# Patient Record
Sex: Female | Born: 1945 | Race: White | Hispanic: No | Marital: Married | Smoking: Never smoker
Health system: Southern US, Community
[De-identification: ages and names within clinical notes are randomized; demographics above are authoritative.]

## PROBLEM LIST (undated history)

## (undated) DIAGNOSIS — I10 Essential (primary) hypertension: Secondary | ICD-10-CM

## (undated) DIAGNOSIS — E7800 Pure hypercholesterolemia, unspecified: Secondary | ICD-10-CM

## (undated) HISTORY — PX: APPENDECTOMY: SHX54

## (undated) HISTORY — PX: LEG SURGERY: SHX1003

## (undated) HISTORY — PX: ABDOMINAL HYSTERECTOMY: SHX81

## (undated) HISTORY — PX: TONSILLECTOMY: SUR1361

---

## 2003-06-29 ENCOUNTER — Other Ambulatory Visit: Payer: Self-pay

## 2008-10-08 ENCOUNTER — Ambulatory Visit: Payer: Self-pay | Admitting: Internal Medicine

## 2015-02-28 ENCOUNTER — Encounter: Payer: Self-pay | Admitting: Emergency Medicine

## 2015-02-28 ENCOUNTER — Ambulatory Visit
Admission: EM | Admit: 2015-02-28 | Discharge: 2015-02-28 | Disposition: A | Discharge status: Home / Self Care | Payer: BLUE CROSS/BLUE SHIELD | Attending: Family Medicine | Admitting: Family Medicine

## 2015-02-28 DIAGNOSIS — N390 Urinary tract infection, site not specified: Secondary | ICD-10-CM | POA: Diagnosis not present

## 2015-02-28 DIAGNOSIS — D696 Thrombocytopenia, unspecified: Secondary | ICD-10-CM

## 2015-02-28 HISTORY — DX: Essential (primary) hypertension: I10

## 2015-02-28 HISTORY — DX: Pure hypercholesterolemia, unspecified: E78.00

## 2015-02-28 LAB — COMPREHENSIVE METABOLIC PANEL
ALT: 17 U/L (ref 14–54)
AST: 21 U/L (ref 15–41)
Albumin: 4.5 g/dL (ref 3.5–5.0)
Alkaline Phosphatase: 65 U/L (ref 38–126)
Anion gap: 1 — ABNORMAL LOW (ref 5–15)
BILIRUBIN TOTAL: 0.7 mg/dL (ref 0.3–1.2)
BUN: 20 mg/dL (ref 6–20)
CO2: 32 mmol/L (ref 22–32)
CREATININE: 0.88 mg/dL (ref 0.44–1.00)
Calcium: 9.3 mg/dL (ref 8.9–10.3)
Chloride: 104 mmol/L (ref 101–111)
Glucose, Bld: 93 mg/dL (ref 65–99)
POTASSIUM: 4.2 mmol/L (ref 3.5–5.1)
Sodium: 137 mmol/L (ref 135–145)
Total Protein: 7.5 g/dL (ref 6.5–8.1)

## 2015-02-28 LAB — CBC WITH DIFFERENTIAL/PLATELET
BASOS ABS: 0 10*3/uL (ref 0–0.1)
Basophils Relative: 1 %
EOS PCT: 4 %
Eosinophils Absolute: 0.2 10*3/uL (ref 0–0.7)
HEMATOCRIT: 39.8 % (ref 35.0–47.0)
Hemoglobin: 13 g/dL (ref 12.0–16.0)
LYMPHS ABS: 1.1 10*3/uL (ref 1.0–3.6)
LYMPHS PCT: 26 %
MCH: 28 pg (ref 26.0–34.0)
MCHC: 32.8 g/dL (ref 32.0–36.0)
MCV: 85.3 fL (ref 80.0–100.0)
MONO ABS: 0.4 10*3/uL (ref 0.2–0.9)
MONOS PCT: 10 %
NEUTROS ABS: 2.7 10*3/uL (ref 1.4–6.5)
Neutrophils Relative %: 59 %
PLATELETS: 137 10*3/uL — AB (ref 150–440)
RBC: 4.67 MIL/uL (ref 3.80–5.20)
RDW: 14.4 % (ref 11.5–14.5)
WBC: 4.5 10*3/uL (ref 3.6–11.0)

## 2015-02-28 LAB — URINALYSIS COMPLETE WITH MICROSCOPIC (ARMC ONLY)
BACTERIA UA: NONE SEEN — AB
BILIRUBIN URINE: NEGATIVE
GLUCOSE, UA: NEGATIVE mg/dL
Ketones, ur: NEGATIVE mg/dL
Nitrite: NEGATIVE
PH: 5.5 (ref 5.0–8.0)
Protein, ur: NEGATIVE mg/dL
RBC / HPF: NONE SEEN RBC/hpf (ref <3)
Specific Gravity, Urine: 1.02 (ref 1.005–1.030)

## 2015-02-28 MED ORDER — SULFAMETHOXAZOLE-TRIMETHOPRIM 800-160 MG PO TABS: 1.0000 | ORAL_TABLET | Freq: Two times a day (BID) | ORAL | Status: DC

## 2015-02-28 NOTE — Discharge Instructions (Signed)
Pyelonephritis, Adult °Pyelonephritis is a kidney infection. In general, there are 2 main types of pyelonephritis: °· Infections that come on quickly without any warning (acute pyelonephritis). °· Infections that persist for a long period of time (chronic pyelonephritis). °CAUSES  °Two main causes of pyelonephritis are: °· Bacteria traveling from the bladder to the kidney. This is a problem especially in pregnant women. The urine in the bladder can become filled with bacteria from multiple causes, including: °· Inflammation of the prostate gland (prostatitis). °· Sexual intercourse in females. °· Bladder infection (cystitis). °· Bacteria traveling from the bloodstream to the tissue part of the kidney. °Problems that may increase your risk of getting a kidney infection include: °· Diabetes. °· Kidney stones or bladder stones. °· Cancer. °· Catheters placed in the bladder. °· Other abnormalities of the kidney or ureter. °SYMPTOMS  °· Abdominal pain. °· Pain in the side or flank area. °· Fever. °· Chills. °· Upset stomach. °· Blood in the urine (dark urine). °· Frequent urination. °· Strong or persistent urge to urinate. °· Burning or stinging when urinating. °DIAGNOSIS  °Your caregiver may diagnose your kidney infection based on your symptoms. A urine sample may also be taken. °TREATMENT  °In general, treatment depends on how severe the infection is.  °· If the infection is mild and caught early, your caregiver may treat you with oral antibiotics and send you home. °· If the infection is more severe, the bacteria may have gotten into the bloodstream. This will require intravenous (IV) antibiotics and a hospital stay. Symptoms may include: °¨ High fever. °¨ Severe flank pain. °¨ Shaking chills. °· Even after a hospital stay, your caregiver may require you to be on oral antibiotics for a period of time. °· Other treatments may be required depending upon the cause of the infection. °HOME CARE INSTRUCTIONS  °· Take your  antibiotics as directed. Finish them even if you start to feel better. °· Make an appointment to have your urine checked to make sure the infection is gone. °· Drink enough fluids to keep your urine clear or pale yellow. °· Take medicines for the bladder if you have urgency and frequency of urination as directed by your caregiver. °SEEK IMMEDIATE MEDICAL CARE IF:  °· You have a fever or persistent symptoms for more than 2-3 days. °· You have a fever and your symptoms suddenly get worse. °· You are unable to take your antibiotics or fluids. °· You develop shaking chills. °· You experience extreme weakness or fainting. °· There is no improvement after 2 days of treatment. °MAKE SURE YOU: °· Understand these instructions. °· Will watch your condition. °· Will get help right away if you are not doing well or get worse. °Document Released: 05/28/2005 Document Revised: 11/27/2011 Document Reviewed: 11/01/2010 °ExitCare® Patient Information ©2015 [employer]. This information is not intended to replace advice given to you by your health care provider. Make sure you discuss any questions you have with your health care provider. ° °Urinary Tract Infection °Urinary tract infections (UTIs) can develop anywhere along your urinary tract. Your urinary tract is your body's drainage system for removing wastes and extra water. Your urinary tract includes two kidneys, two ureters, a bladder, and a urethra. Your kidneys are a pair of bean-shaped organs. Each kidney is about the size of your fist. They are located below your ribs, one on each side of your spine. °CAUSES °Infections are caused by microbes, which are microscopic organisms, including fungi, viruses, and bacteria. These   organisms are so small that they can only be seen through a microscope. Bacteria are the microbes that most commonly cause UTIs. °SYMPTOMS  °Symptoms of UTIs may vary by age and gender of the patient and by the location of the infection. Symptoms in young  women typically include a frequent and intense urge to urinate and a painful, burning feeling in the bladder or urethra during urination. Older women and men are more likely to be tired, shaky, and weak and have muscle aches and abdominal pain. A fever may mean the infection is in your kidneys. Other symptoms of a kidney infection include pain in your back or sides below the ribs, nausea, and vomiting. °DIAGNOSIS °To diagnose a UTI, your caregiver will ask you about your symptoms. Your caregiver also will ask to provide a urine sample. The urine sample will be tested for bacteria and white blood cells. White blood cells are made by your body to help fight infection. °TREATMENT  °Typically, UTIs can be treated with medication. Because most UTIs are caused by a bacterial infection, they usually can be treated with the use of antibiotics. The choice of antibiotic and length of treatment depend on your symptoms and the type of bacteria causing your infection. °HOME CARE INSTRUCTIONS °· If you were prescribed antibiotics, take them exactly as your caregiver instructs you. Finish the medication even if you feel better after you have only taken some of the medication. °· Drink enough water and fluids to keep your urine clear or pale yellow. °· Avoid caffeine, tea, and carbonated beverages. They tend to irritate your bladder. °· Empty your bladder often. Avoid holding urine for long periods of time. °· Empty your bladder before and after sexual intercourse. °· After a bowel movement, women should cleanse from front to back. Use each tissue only once. °SEEK MEDICAL CARE IF:  °· You have back pain. °· You develop a fever. °· Your symptoms do not begin to resolve within 3 days. °SEEK IMMEDIATE MEDICAL CARE IF:  °· You have severe back pain or lower abdominal pain. °· You develop chills. °· You have nausea or vomiting. °· You have continued burning or discomfort with urination. °MAKE SURE YOU:  °· Understand these  instructions. °· Will watch your condition. °· Will get help right away if you are not doing well or get worse. °Document Released: 03/07/2005 Document Revised: 11/27/2011 Document Reviewed: 07/06/2011 °ExitCare® Patient Information ©2015 [employer]. This information is not intended to replace advice given to you by your health care provider. Make sure you discuss any questions you have with your health care provider. ° °

## 2015-02-28 NOTE — ED Provider Notes (Addendum)
CSN: [id number]     Arrival date & time 02/28/15  4098 History   First MD Initiated Contact with Patient 02/28/15 1047     Chief Complaint  Patient presents with  . Flank Pain   (Consider location/radiation/quality/duration/timing/severity/associated sxs/prior Treatment) HPI Comments: Single caucasian female here for evaluation of right flank pain decreased urinary output.  Patient took OTC test that was positive for UTI over the weekend.  Contacted PCM and urologist office this am and was told to go to urgent care this am or could have afternoon appt per patient.  Patient reported headache (usual daily), right flank pain denied fever, chills, vomiting, nausea, dyspnea, dysphagia, diarrhea.  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  Patient required IV antibiotics for last UTI and unsure what she was on.  Has history of thrombocytopenia  The history is provided by the patient.    Past Medical History  Diagnosis Date  . Hypertension   . Hypercholesteremia    Past Surgical History  Procedure Laterality Date  . Leg surgery Left   . Abdominal hysterectomy    . Tonsillectomy    . Appendectomy     History reviewed. No pertinent family history. Social History  Substance Use Topics  . Smoking status: Never Smoker   . Smokeless tobacco: None  . Alcohol Use: No   OB History    No data available     Review of Systems  Constitutional: Negative for fever, chills, diaphoresis, activity change, appetite change and fatigue.  HENT: Negative for congestion, dental problem, drooling, ear discharge, ear pain, facial swelling, hearing loss, mouth sores, nosebleeds, postnasal drip, rhinorrhea, sinus pressure, sneezing, sore throat, tinnitus, trouble swallowing and voice change.   Eyes: Negative for photophobia, pain, discharge, redness, itching and visual disturbance.  Respiratory: Negative  for cough, shortness of breath, wheezing and stridor.   Cardiovascular: Negative for chest pain and leg swelling.  Gastrointestinal: Negative for nausea, vomiting, abdominal pain, diarrhea, constipation, blood in stool and abdominal distention.  Endocrine: Negative for cold intolerance and heat intolerance.  Genitourinary: Positive for flank pain and decreased urine volume. Negative for dysuria, hematuria and difficulty urinating.  Musculoskeletal: Positive for back pain and gait problem. Negative for myalgias, joint swelling, arthralgias, neck pain and neck stiffness.  Skin: Negative for color change, pallor, rash and wound.  Allergic/Immunologic: Negative for environmental allergies and food allergies.  Neurological: Positive for headaches. Negative for dizziness, tremors, seizures, syncope, facial asymmetry, speech difficulty, weakness, light-headedness and numbness.  Hematological: Negative for adenopathy. Does not bruise/bleed easily.  Psychiatric/Behavioral: Positive for sleep disturbance. Negative for behavioral problems, confusion and agitation.    Allergies  Review of patient's allergies indicates no known allergies.  Home Medications   Prior to Admission medications   Medication Sig Start Date End Date Taking? Authorizing Provider  citalopram (CELEXA) 20 MG tablet Take 20 mg by mouth daily.   Yes Historical Provider, MD  lisinopril (PRINIVIL,ZESTRIL) 40 MG tablet Take 40 mg by mouth daily.   Yes Historical Provider, MD  potassium chloride (K-DUR,KLOR-CON) 10 MEQ tablet Take 10 mEq by mouth 4 (four) times daily.   Yes Historical Provider, MD  simvastatin (ZOCOR) 20 MG tablet Take 20 mg by mouth daily.   Yes Historical Provider, MD  vitamin B-12 (CYANOCOBALAMIN) 500 MCG tablet Take 500 mcg by mouth daily.   Yes Historical Provider, MD  vitamin E 1000 UNIT capsule Take  1,000 Units by mouth daily.   Yes Historical Provider, MD  sulfamethoxazole-trimethoprim (BACTRIM DS,SEPTRA DS)  800-160 MG per tablet Take 1 tablet by mouth 2 (two) times daily. 02/28/15   Barbaraann Barthel, NP   Meds Ordered and Administered this Visit  Medications - No data to display  BP 178/78 mmHg  Pulse 65  Temp(Src) 98 F (36.7 C) (Oral)  Resp 16  Ht  (1.676 m)  Wt 215 lb (97.523 kg)  BMI 34.72 kg/m2  SpO2 97% No data found.   Physical Exam  Constitutional: She is oriented to person, place, and time. Vital signs are normal. She appears well-developed and well-nourished. No distress.  HENT:  Head: Normocephalic and atraumatic.  Right Ear: External ear normal.  Left Ear: External ear normal.  Nose: Nose normal.  Mouth/Throat: Oropharynx is clear and moist. No oropharyngeal exudate.  Eyes: Conjunctivae, EOM and lids are normal. Pupils are equal, round, and reactive to light. Right eye exhibits no discharge. Left eye exhibits no discharge. No scleral icterus.  Neck: Trachea normal and normal range of motion. Neck supple. No tracheal deviation present.  Cardiovascular: Normal rate, regular rhythm, S1 normal, S2 normal, normal heart sounds and intact distal pulses.  PMI is not displaced.  Exam reveals no gallop, no distant heart sounds, no friction rub and no decreased pulses.   No murmur heard. Pulmonary/Chest: Effort normal and breath sounds normal. No accessory muscle usage or stridor. No respiratory distress. She has no decreased breath sounds. She has no wheezes. She has no rhonchi. She has no rales. She exhibits no tenderness.  Abdominal: Soft. Bowel sounds are normal. She exhibits no shifting dullness, no distension, no pulsatile liver, no fluid wave, no abdominal bruit, no ascites, no pulsatile midline mass and no mass. There is no hepatosplenomegaly. There is tenderness in the right upper quadrant, right lower quadrant and left upper quadrant. There is CVA tenderness. There is no rigidity, no rebound, no guarding, no tenderness at McBurney's point and negative Murphy's sign. Hernia  confirmed negative in the ventral area.  RUQ most tender to palpations then RLQ and LUQ; right CVA tenderness; dull to percussion x 4 quads  Musculoskeletal: She exhibits edema. She exhibits no tenderness.       Left knee: She exhibits decreased range of motion.       Right lower leg: She exhibits swelling and edema. She exhibits no tenderness, no bony tenderness, no deformity and no laceration.       Left lower leg: Normal.  0-1+/4 right lower extremity nonpitting edema; titanium rod LLE keeps knee full extension  Lymphadenopathy:    She has no cervical adenopathy.  Neurological: She is alert and oriented to person, place, and time. She has normal reflexes. She displays no atrophy, no tremor and normal reflexes. No cranial nerve deficit or sensory deficit. She exhibits normal muscle tone. She displays no seizure activity. Gait abnormal. Coordination normal. GCS eye subscore is 4. GCS verbal subscore is 5. GCS motor subscore is 6.  Skin: Skin is warm, dry and intact. No abrasion, no bruising, no burn, no ecchymosis, no laceration, no lesion, no petechiae and no rash noted. She is not diaphoretic. No cyanosis or erythema. No pallor. Nails show no clubbing.  Psychiatric: She has a normal mood and affect. Her speech is normal and behavior is normal. Judgment and thought content normal. Cognition and memory are normal.  Nursing note and vitals reviewed.   ED Course  Procedures (including critical care time)  Labs Review Labs Reviewed  URINALYSIS COMPLETEWITH MICROSCOPIC ([facility] ONLY) - Abnormal; Notable for the following:    Hgb urine dipstick 1+ (*)    Leukocytes, UA 1+ (*)    Bacteria, UA NONE SEEN (*)    Squamous Epithelial / LPF 0-5 (*)    All other components within normal limits  CBC WITH DIFFERENTIAL/PLATELET - Abnormal; Notable for the following:    Platelets 137 (*)    All other components within normal limits  COMPREHENSIVE METABOLIC PANEL - Abnormal; Notable for the following:     Anion gap 1 (*)    All other components within normal limits  URINE CULTURE  Micro Report: Final 06/08/2004 14:25 Acc# [id number] Acct# [id number]  CULTURE BACTERIA URINE Updated: 06/11/2004 11:47 SOURCE: URINE-CLEAN CATCH  FINAL REPORT:CULTURE FINAL REPORT COLONIES/ML ENTEROBACTER AEROGENES   SUSCEPTIBILITY: E AEROGE   AMPICILLIN R  PIPERACIL/TAZOB I  AMOX/CLAVULAN A R  CEFAZOLIN R  GENTAMICIN S  TRIMETH/SULFA S  NITROFURANTOIN I  CIPROFLOXACIN S   ATTENDING MD: Sula Rumple MD: [place].Blalock  PERFORMED BY: CLINICAL MICRO LAB RM [address] [facility] [place] [address]  >=100,000 colonies/mL Escherichia coli (A) Comment: strain 1   Culture Urine >=100,000 colonies/mL Escherichia coli (A) Comment: strain 2    Specimen  Urine - Urinary Bladder   Result Narrative   Specific collection method (i.e. Clean catch, Straight catheter, Indwelling catheter) is required for accurate processing of urine samples. If collection method not specified, specimen processed according to Clean Catch urine protocol.   Organism Antibiotic Method Susceptibility  Escherichia coli Amikacin MIC Susceptible   Ampicillin MIC Susceptible   Ampicillin + Sulbactam MIC Susceptible   Cefazolin MIC Resistant   Comment: Cefazolin results predict results for oral agents cephalexin,cefaclor,cefdinir,etc.   Cefepime MIC Susceptible   Ceftriaxone MIC Resistant   Cefuroxime MIC Susceptible   Ciprofloxacin MIC Resistant   Ertapenem MIC Susceptible   Gentamicin MIC Susceptible   Meropenem MIC Susceptible   Nitrofurantoin MIC Susceptible   Piperacillin/Tazobactam MIC Susceptible   Tetracycline MIC Susceptible   Tobramycin MIC Susceptible   Trimethoprim + Sulfamethoxazole MIC Susceptible  Escherichia coli Amikacin MIC Susceptible   Ampicillin MIC Susceptible   Ampicillin + Sulbactam MIC Susceptible   Cefazolin MIC Resistant   Comment: Cefazolin results predict results for oral agents  cephalexin,cefaclor,cefdinir,etc.   Cefepime MIC Susceptible   Ceftriaxone MIC Resistant   Cefuroxime MIC Susceptible   Ciprofloxacin MIC Resistant   Ertapenem MIC Susceptible   Gentamicin MIC Susceptible   Meropenem MIC Susceptible   Nitrofurantoin MIC Susceptible   Piperacillin/Tazobactam MIC Susceptible   Tetracycline MIC Susceptible   Tobramycin MIC Susceptible   Trimethoprim + Sulfamethoxazole MIC Susceptible    Imaging Review No results found. 1100 discussed urinalysis results with patient and given copy of report.  Patient cannot remember what antibiotics she was on during hospitalization or discharge to home May/June 2016.  1140 Discussed with patient CBC normal except slightly low platelets stable from previous.  CMP still pending.  Drinking water without difficulty and sitting in chair in exam room.  Patient verbalized understanding of information and had no further questions at this time.   1205 Discussed CMP results with patient normal. MDM   1. Thrombocytopenia   2. UTI (lower urinary tract infection)    Will call patient with urine culture results once available typically 48 hours.  Medications as directed.  Patient is also to push fluids.  Scheduled follow up with [facility] or urology in 48 hours  for re-evaluation/labs as bactrim may cause hyperkalemia but previous urine cultures with resistance to all other po antibiotics upon review of care everywhere.    Hydrate, avoid dehydration.  Avoid holding urine void on frequent basis every 4 to 6 hours.  If unable to void every 8 hours follow up for re-evaluation with PCM, urgent care or ER.   Call or return to clinic as needed if these symptoms worsen or fail to improve as anticipated.  Exitcare handout on cystitis, pyelonephritis given to patient Patient verbalized agreement and understanding of treatment plan and had no further questions at this time. P2:  Hydrate and cranberry juice  Thrombocytopenia follow up with PCM for  re-evaluation  This week sooner (same day) if bruises noted or difficulty with blood clotting after cuts.  Patient verbalized understanding of information/instructions, agreed with plan of care and had no further questions at this time. P2 avoid cuts/trauma  Barbaraann Barthel, NP 02/28/15 1217  Telephone message left for patient to contact clinic to verify if follow up appt with urology or PCM kept for repeat labs this week. Urine culture contaminated and if still symptomatic recommend repeat labs.  03 Mar 2015 at 0714  Barbaraann Barthel, NP 03/03/15 1610  11 Mar 2015 at 1408 Contacted patient via telephone notified urine culture contaminated/grew multiple species.  Patient reported symptoms resolved with prescribed therapy.  Patient verbalized understanding of information and had no further questions at this time  Barbaraann Barthel, NP 03/11/15 1409

## 2015-02-28 NOTE — ED Notes (Signed)
Patient c/o right sided flank pain since last Thursday.  Patient denies fevers.  Patient denies N/V.

## 2015-03-02 LAB — URINE CULTURE: Special Requests: NORMAL

## 2015-06-03 ENCOUNTER — Encounter: Payer: Self-pay | Admitting: Emergency Medicine

## 2015-06-03 ENCOUNTER — Ambulatory Visit
Admission: EM | Admit: 2015-06-03 | Discharge: 2015-06-03 | Disposition: A | Discharge status: Home / Self Care | Payer: BLUE CROSS/BLUE SHIELD | Attending: Family Medicine | Admitting: Family Medicine

## 2015-06-03 DIAGNOSIS — N390 Urinary tract infection, site not specified: Secondary | ICD-10-CM | POA: Diagnosis not present

## 2015-06-03 LAB — URINALYSIS COMPLETE WITH MICROSCOPIC (ARMC ONLY)
Bacteria, UA: NONE SEEN
Bilirubin Urine: NEGATIVE
Glucose, UA: NEGATIVE mg/dL
KETONES UR: NEGATIVE mg/dL
Nitrite: NEGATIVE
PH: 5.5 (ref 5.0–8.0)
Protein, ur: 30 mg/dL — AB
Specific Gravity, Urine: 1.03 (ref 1.005–1.030)

## 2015-06-03 MED ORDER — PHENAZOPYRIDINE HCL 200 MG PO TABS: 200.0000 mg | ORAL_TABLET | Freq: Three times a day (TID) | ORAL | Status: DC

## 2015-06-03 MED ORDER — SULFAMETHOXAZOLE-TRIMETHOPRIM 800-160 MG PO TABS
1.0000 | ORAL_TABLET | Freq: Two times a day (BID) | ORAL | Status: DC
Start: 2015-06-03 — End: 2020-03-09

## 2015-06-03 NOTE — Discharge Instructions (Signed)
Urinary Tract Infection Urinary tract infections (UTIs) can develop anywhere along your urinary tract. Your urinary tract is your body's drainage system for removing wastes and extra water. Your urinary tract includes two kidneys, two ureters, a bladder, and a urethra. Your kidneys are a pair of bean-shaped organs. Each kidney is about the size of your fist. They are located below your ribs, one on each side of your spine. CAUSES Infections are caused by microbes, which are microscopic organisms, including fungi, viruses, and bacteria. These organisms are so small that they can only be seen through a microscope. Bacteria are the microbes that most commonly cause UTIs. SYMPTOMS  Symptoms of UTIs may vary by age and gender of the patient and by the location of the infection. Symptoms in young women typically include a frequent and intense urge to urinate and a painful, burning feeling in the bladder or urethra during urination. Older women and men are more likely to be tired, shaky, and weak and have muscle aches and abdominal pain. A fever may mean the infection is in your kidneys. Other symptoms of a kidney infection include pain in your back or sides below the ribs, nausea, and vomiting. DIAGNOSIS To diagnose a UTI, your caregiver will ask you about your symptoms. Your caregiver will also ask you to provide a urine sample. The urine sample will be tested for bacteria and white blood cells. White blood cells are made by your body to help fight infection. TREATMENT  Typically, UTIs can be treated with medication. Because most UTIs are caused by a bacterial infection, they usually can be treated with the use of antibiotics. The choice of antibiotic and length of treatment depend on your symptoms and the type of bacteria causing your infection. HOME CARE INSTRUCTIONS  If you were prescribed antibiotics, take them exactly as your caregiver instructs you. Finish the medication even if you feel better after  you have only taken some of the medication.  Drink enough water and fluids to keep your urine clear or pale yellow.  Avoid caffeine, tea, and carbonated beverages. They tend to irritate your bladder.  Empty your bladder often. Avoid holding urine for long periods of time.  Empty your bladder before and after sexual intercourse.  After a bowel movement, women should cleanse from front to back. Use each tissue only once. SEEK MEDICAL CARE IF:   You have back pain.  You develop a fever.  Your symptoms do not begin to resolve within 3 days. SEEK IMMEDIATE MEDICAL CARE IF:   You have severe back pain or lower abdominal pain.  You develop chills.  You have nausea or vomiting.  You have continued burning or discomfort with urination. MAKE SURE YOU:   Understand these instructions.  Will watch your condition.  Will get help right away if you are not doing well or get worse.   This information is not intended to replace advice given to you by your health care provider. Make sure you discuss any questions you have with your health care provider.   Document Released: 03/07/2005 Document Revised: 02/16/2015 Document Reviewed: 07/06/2011 Elsevier Interactive Patient Education 2016 [employer]  Urine Culture and Sensitivity Testing WHY AM I HAVING THIS TEST?  A urine culture is a test to see if germs grow from your urine sample. Normally, urine is free of germs (sterile). Germs in urine are usually bacteria. Sometimes they can be yeasts. These germs can cause a urinary tract infection (UTI). You may have this test if  you have symptoms of a UTI. These may include:  Frequent urination.  Burning pain when passing urine. If you are pregnant, your health care provider may order this test to screen you for a UTI. When you pass urine, the urine flows through the tube that empties your bladder (urethra). In men, urine comes out through an opening at the tip of the penis. In women, it  comes out of the body from just above the vaginal opening. These areas may have bacteria near them that normally live on the skin (normal flora). WHAT KIND OF SAMPLE IS TAKEN? A urine sample for a culture test must be collected in a way that keeps normal flora from getting into the sample. The method used most often is called a clean-catch sample. In a few cases, urine may need to be collected directly from the bladder using a thin, flexible tube (catheter). The health care provider puts the catheter through the person's urethra and into the bladder. Your urine sample will be placed onto plates containing a substance that encourages bacteria to grow (agar plates). These plates are kept at body temperature for 24-48 hours to see if bacteria or other germs grow. Then, a lab technician examines them under a microscope to check for germs. Any germs that grow from the culture will be tested against a variety of medicines to find the one that works best (sensitivity testing). For a UTI caused by bacteria, several types of antibiotic medicines may be tested. HOW DO I PREPARE FOR THE TEST?  Do not urinate for about an hour before collecting the sample.  Drink a Jorge of water about 20 minutes before collecting the sample.  Tell your health care provider if you have been taking antibiotics. This may affect the results of your test. Your health care provider may give you sterile wipes to clean your vagina or penis to prepare for collecting a clean-catch sample. To collect the sample, you will need to do the following: For Women and Girls  Sit on the toilet and spread the lips of your vagina.  Use one wipe to clean your vaginal area from front to back.  Use a second wipe to clean the opening of your urethra.  Pass a small amount of urine directly into the toilet while still spreading your vagina.  Then, hold the sterile cup underneath you and urinate into it.  Fill the cup about halfway. Cap it and  return it for testing. For Men and Boys  Use the sterile wipe to clean the tip of your penis.  Pass a small amount of urine directly into the toilet first.  Then, urinate into the sterile cup.  Fill the cup about halfway. Cap it and return it for testing. WHAT DO THE RESULTS MEAN? The result of a urine culture and sensitivity test will be positive or negative.   If enough bacteria grow from your urine sample, your test result is considered positive.  If many different bacteria grow from your urine sample, your test may be reported as contaminated.  If no bacteria grow from your sample after 24-48 hours, your test result is considered negative.  Results of sensitivity testing let your health care provider know which medicines to use to treat your infection. If the results of your urine culture are negative, this means:  It is less likely that you have a UTI.  Your test may be repeated if you still have symptoms. If the results of your urine culture are positive,  this means:  It is more likely that you have a UTI.  You may need to start treatment based on your sensitivity results. Talk to your health care provider to discuss your results, treatment options, and if necessary, the need for more tests. It is your responsibility to obtain your test results. Ask the lab or department performing the test when and how you will get your results. Talk with your health care provider if you have any questions about your results.   This information is not intended to replace advice given to you by your health care provider. Make sure you discuss any questions you have with your health care provider.   Document Released: 06/22/2004 Document Revised: 06/18/2014 Document Reviewed: 09/24/2013 Elsevier Interactive Patient Education [employer] Elsevier Inc.

## 2015-06-03 NOTE — ED Notes (Signed)
Burning, pressure, frequent urination  and pain for 2 days

## 2015-06-03 NOTE — ED Provider Notes (Signed)
CSN: [id number]     Arrival date & time 06/03/15  1349 History   First MD Initiated Contact with Patient 06/03/15 1431     Chief Complaint  Patient presents with  . Dysuria   (Consider location/radiation/quality/duration/timing/severity/associated sxs/prior Treatment) HPI  This 69 year old female who presents with dysuria and burning lower abdominal pressure frequency urgency and in satiety for the past 2 days. She states it seems that she be getting a UTI every 6 months. She recently underwent a stenting of her urethra from a previous UTI. She denies any vaginal discharge. Denies any fever is afebrile the office today. She said no chills and denies any back pain other than some right flank pain that she's had since having a indwelling catheter removed. This is chronic.  Past Medical History  Diagnosis Date  . Hypertension   . Hypercholesteremia    Past Surgical History  Procedure Laterality Date  . Leg surgery Left   . Abdominal hysterectomy    . Tonsillectomy    . Appendectomy     Family History  Problem Relation Age of Onset  . Coronary artery disease Mother   . Hypertension Mother   . Coronary artery disease Father   . Hypertension Father   . Cancer Sister   . Cancer Brother    Social History  Substance Use Topics  . Smoking status: Never Smoker   . Smokeless tobacco: None  . Alcohol Use: No   OB History    No data available     Review of Systems  Constitutional: Negative for fever, chills, diaphoresis, activity change, appetite change and fatigue.  Genitourinary: Positive for dysuria, urgency, frequency, decreased urine volume and difficulty urinating. Negative for vaginal discharge.  All other systems reviewed and are negative.   Allergies  Review of patient's allergies indicates no known allergies.  Home Medications   Prior to Admission medications   Medication Sig Start Date End Date Taking? Authorizing Provider  amLODipine (NORVASC) 10 MG tablet Take  10 mg by mouth daily.   Yes Historical Provider, MD  citalopram (CELEXA) 20 MG tablet Take 20 mg by mouth daily.    Historical Provider, MD  lisinopril (PRINIVIL,ZESTRIL) 40 MG tablet Take 40 mg by mouth daily.    Historical Provider, MD  phenazopyridine (PYRIDIUM) 200 MG tablet Take 1 tablet (200 mg total) by mouth 3 (three) times daily. 06/03/15   Lutricia FeilWilliam P Madelynne Lasker, PA-C  potassium chloride (K-DUR,KLOR-CON) 10 MEQ tablet Take 10 mEq by mouth 4 (four) times daily.    Historical Provider, MD  simvastatin (ZOCOR) 20 MG tablet Take 20 mg by mouth daily.    Historical Provider, MD  sulfamethoxazole-trimethoprim (BACTRIM DS,SEPTRA DS) 800-160 MG tablet Take 1 tablet by mouth 2 (two) times daily. 06/03/15   Lutricia FeilWilliam P Clois Treanor, PA-C  vitamin B-12 (CYANOCOBALAMIN) 500 MCG tablet Take 500 mcg by mouth daily.    Historical Provider, MD  vitamin E 1000 UNIT capsule Take 1,000 Units by mouth daily.    Historical Provider, MD   Meds Ordered and Administered this Visit  Medications - No data to display  BP 142/81 mmHg  Pulse 72  Temp(Src) 97.8 F (36.6 C) (Tympanic)  Resp 16  Ht 5\' 7"  (1.702 m)  Wt 205 lb (92.987 kg)  BMI 32.10 kg/m2  SpO2 98% No data found.   Physical Exam  Constitutional: She is oriented to person, place, and time. She appears well-developed and well-nourished. No distress.  HENT:  Head: Normocephalic and atraumatic.  Eyes: Conjunctivae  are normal. Pupils are equal, round, and reactive to light.  Neck: Normal range of motion. Neck supple.  Pulmonary/Chest: Breath sounds normal. No respiratory distress. She has no wheezes. She has no rales.  Abdominal: Soft. Bowel sounds are normal. She exhibits no distension. There is tenderness. There is no rebound and no guarding.  Musculoskeletal: Normal range of motion. She exhibits no edema or tenderness.  Left knee is fused in extension  Neurological: She is alert and oriented to person, place, and time.  Skin: Skin is warm and dry. No  rash noted. She is not diaphoretic. No erythema.  Psychiatric: She has a normal mood and affect. Her behavior is normal. Judgment and thought content normal.  Nursing note and vitals reviewed.   ED Course  Procedures (including critical care time)  Labs Review Labs Reviewed  URINALYSIS COMPLETEWITH MICROSCOPIC ([facility] ONLY) - Abnormal; Notable for the following:    APPearance CLOUDY (*)    Hgb urine dipstick 1+ (*)    Protein, ur 30 (*)    Leukocytes, UA 2+ (*)    Squamous Epithelial / LPF 0-5 (*)    All other components within normal limits    Imaging Review No results found.   Visual Acuity Review  Right Eye Distance:   Left Eye Distance:   Bilateral Distance:    Right Eye Near:   Left Eye Near:    Bilateral Near:         MDM   1. UTI (urinary tract infection), uncomplicated    Discharge Medication List as of 06/03/2015  2:53 PM    START taking these medications   Details  phenazopyridine (PYRIDIUM) 200 MG tablet Take 1 tablet (200 mg total) by mouth 3 (three) times daily., Starting 06/03/2015, Until Discontinued, Normal      Plan: 1. Test/x-ray results and diagnosis reviewed with patient 2. rx as per orders; risks, benefits, potential side effects reviewed with patient 3. Recommend supportive treatment with Fluids,rest. 4. F/u prn if symptoms worsen or don't improve. Call in 48 hours for C&S results. Patient placed on Septra DS BID x 5 days.      Lutricia Feil, PA-C 06/03/15 [phone]

## 2015-06-29 ENCOUNTER — Ambulatory Visit
Admission: EM | Admit: 2015-06-29 | Discharge: 2015-06-29 | Payer: BLUE CROSS/BLUE SHIELD | Attending: Family Medicine | Admitting: Family Medicine

## 2015-06-29 DIAGNOSIS — S0181XA Laceration without foreign body of other part of head, initial encounter: Secondary | ICD-10-CM

## 2015-06-29 MED ORDER — TETANUS-DIPHTH-ACELL PERTUSSIS 5-2.5-18.5 LF-MCG/0.5 IM SUSP
0.5000 mL | Freq: Once | INTRAMUSCULAR | Status: AC
  Administered 2015-06-29: 0.5 mL via INTRAMUSCULAR

## 2015-06-29 NOTE — Discharge Instructions (Signed)
Facial Laceration °A facial laceration is a cut on the face. These injuries can be painful and cause bleeding. Some cuts may need to be closed with stitches (sutures), skin adhesive strips, or wound glue. Cuts usually heal quickly but can leave a scar. It can take 1-2 years for the scar to go away completely. °HOME CARE  °· Only take medicines as told by your doctor. °· Follow your doctor's instructions for wound care. °For Stitches: °· Keep the cut clean and dry. °· If you have a bandage (dressing), change it at least once a day. Change the bandage if it gets wet or dirty, or as told by your doctor. °· Wash the cut with soap and water 2 times a day. Rinse the cut with water. Pat it dry with a clean towel. °· Put a thin layer of medicated cream on the cut as told by your doctor. °· You may shower after the first 24 hours. Do not soak the cut in water until the stitches are removed. °· Have your stitches removed as told by your doctor. °· Do not wear any makeup until a few days after your stitches are removed. °For Skin Adhesive Strips: °· Keep the cut clean and dry. °· Do not get the strips wet. You may take a bath, but be careful to keep the cut dry. °· If the cut gets wet, pat it dry with a clean towel. °· The strips will fall off on their own. Do not remove the strips that are still stuck to the cut. °For Wound Glue: °· You may shower or take baths. Do not soak or scrub the cut. Do not swim. Avoid heavy sweating until the glue falls off on its own. After a shower or bath, pat the cut dry with a clean towel. °· Do not put medicine or makeup on your cut until the glue falls off. °· If you have a bandage, do not put tape over the glue. °· Avoid lots of sunlight or tanning lamps until the glue falls off. °· The glue will fall off on its own in 5-10 days. Do not pick at the glue. °After Healing: °· Put sunscreen on the cut for the first year to reduce your scar. °GET HELP IF: °· You have a fever. °GET HELP RIGHT AWAY  IF:  °· Your cut area gets red, painful, or puffy (swollen). °· You see a yellowish-white fluid (pus) coming from the cut. °  °This information is not intended to replace advice given to you by your health care provider. Make sure you discuss any questions you have with your health care provider. °  °Document Released: 11/14/2007 Document Revised: 06/18/2014 Document Reviewed: 01/08/2013 °Elsevier Interactive Patient Education ©2016 Elsevier Inc. ° °

## 2015-06-29 NOTE — ED Notes (Signed)
States slipped and fell hitting forehead on metal railing. Denies LOC. Also c/o right hand pain. 3-4 inch vertical laceration above left eye. Dr. Thurmond Butts at bedside

## 2015-06-29 NOTE — ED Notes (Signed)
Pt advised transfer to ED. EMS transport requested at this time by this RN. Pt is requesting to go to [facility], ED.

## 2015-06-29 NOTE — ED Provider Notes (Signed)
CSN: [id number]     Arrival date & time 06/29/15  1529 History   First MD Initiated Contact with Patient 06/29/15 1555    Nurses notes were reviewed. Chief Complaint  Patient presents with  . Laceration  . Head Injury    Patient reports falling today hitting her head against a metal bar of railing on her deck. She denies being knocked out but states that she bled profusely. She is a difficult tube with improvement with bleeding so she came to the urgent care to be seen and evaluated.  She denies being on blood thinner or aspirin on a daily basis.   (Consider location/radiation/quality/duration/timing/severity/associated sxs/prior Treatment) Patient is a 70 y.o. female presenting with skin laceration and head injury. The history is provided by the patient and a relative. No language interpreter was used.  Laceration Location:  Head/neck Head/neck laceration location:  Head Length (cm):  10 Depth:  Through underlying tissue Quality: straight   Bleeding: controlled   Laceration mechanism:  Metal edge Pain details:    Quality:  Aching   Severity:  Moderate   Timing:  Constant Foreign body present:  No foreign bodies Worsened by:  Nothing tried Tetanus status:  Out of date Head Injury Associated symptoms: headache     Past Medical History  Diagnosis Date  . Hypertension   . Hypercholesteremia    Past Surgical History  Procedure Laterality Date  . Leg surgery Left   . Abdominal hysterectomy    . Tonsillectomy    . Appendectomy    . Leg surgery      x4   Family History  Problem Relation Age of Onset  . Coronary artery disease Mother   . Hypertension Mother   . Coronary artery disease Father   . Hypertension Father   . Cancer Sister   . Cancer Brother    Social History  Substance Use Topics  . Smoking status: Never Smoker   . Smokeless tobacco: None  . Alcohol Use: No   OB History    No data available     Review of Systems  HENT: Positive for facial  swelling.   Neurological: Positive for headaches.  All other systems reviewed and are negative.   Allergies  Review of patient's allergies indicates no known allergies.  Home Medications   Prior to Admission medications   Medication Sig Start Date End Date Taking? Authorizing Provider  amLODipine (NORVASC) 10 MG tablet Take 10 mg by mouth daily.   Yes Historical Provider, MD  citalopram (CELEXA) 20 MG tablet Take 20 mg by mouth daily.   Yes Historical Provider, MD  potassium chloride (K-DUR,KLOR-CON) 10 MEQ tablet Take 10 mEq by mouth 4 (four) times daily.   Yes Historical Provider, MD  simvastatin (ZOCOR) 20 MG tablet Take 20 mg by mouth daily.   Yes Historical Provider, MD  vitamin B-12 (CYANOCOBALAMIN) 500 MCG tablet Take 500 mcg by mouth daily.   Yes Historical Provider, MD  vitamin E 1000 UNIT capsule Take 1,000 Units by mouth daily.   Yes Historical Provider, MD  amLODipine (NORVASC) 10 MG tablet Take 10 mg by mouth daily.    Historical Provider, MD  lisinopril (PRINIVIL,ZESTRIL) 40 MG tablet Take 40 mg by mouth daily.    Historical Provider, MD  phenazopyridine (PYRIDIUM) 200 MG tablet Take 1 tablet (200 mg total) by mouth 3 (three) times daily. 06/03/15   Lutricia Feil, PA-C  sulfamethoxazole-trimethoprim (BACTRIM DS,SEPTRA DS) 800-160 MG tablet Take 1 tablet by mouth  2 (two) times daily. 06/03/15   Lutricia Feil, PA-C   Meds Ordered and Administered this Visit   Medications  Tdap (BOOSTRIX) injection 0.5 mL (0.5 mLs Intramuscular Given 06/29/15 1555)    BP 182/91 mmHg  Pulse 90  Temp(Src) 97.5 F (36.4 C) (Tympanic)  Resp 18  Ht  (1.702 m)  Wt 220 lb (99.791 kg)  BMI 34.45 kg/m2  SpO2 96% No data found.   Physical Exam  Constitutional: She is oriented to person, place, and time. She appears well-nourished.  HENT:  Head: Head is with laceration.    Right Ear: External ear normal.  Left Ear: External ear normal.  Nose: Nose normal.  Mouth/Throat:  Oropharynx is clear and moist.  Eyes: Conjunctivae are normal. Pupils are equal, round, and reactive to light.  Neck: Normal range of motion.  Musculoskeletal: Normal range of motion.  Neurological: She is alert and oriented to person, place, and time.  Skin: Skin is warm and dry.  Psychiatric: She has a normal mood and affect.  Vitals reviewed.   ED Course  Procedures (including critical care time)  Labs Review Labs Reviewed - No data to display  Imaging Review No results found.   Visual Acuity Review  Right Eye Distance:   Left Eye Distance:   Bilateral Distance:    Right Eye Near:   Left Eye Near:    Bilateral Near:         MDM   1. Laceration of face with complication, initial encounter    Because of the extent of the head injury I recommend she get a CT scan which we do not have the ability to do this afternoon. I'm also recommending that she see ENT of facial plasty since done here could potentially a horrendous facial scar. Patient insists on not going to [facility] and station was continued. EMS was called but patient was explained that except EMS point file decision.  Patient's happened file decision because they are going to [facility] ED.    Hassan Rowan, MD 06/29/15 2038

## 2015-06-29 NOTE — ED Notes (Signed)
Family at bedside. Laceration cleansed with Hibiclens. Sterile 4x4 pressure dressing applied. EMS en route to pick up patient,

## 2015-08-15 ENCOUNTER — Ambulatory Visit: Payer: BLUE CROSS/BLUE SHIELD | Attending: Orthopedic Surgery | Admitting: Occupational Therapy

## 2015-08-15 DIAGNOSIS — M6281 Muscle weakness (generalized): Secondary | ICD-10-CM | POA: Diagnosis present

## 2015-08-15 DIAGNOSIS — M79641 Pain in right hand: Secondary | ICD-10-CM | POA: Diagnosis present

## 2015-08-15 DIAGNOSIS — M25641 Stiffness of right hand, not elsewhere classified: Secondary | ICD-10-CM | POA: Insufficient documentation

## 2015-08-15 NOTE — Patient Instructions (Signed)
HEat AAROM DIP and PIP flexion 2nd and 3rd  MC flexion  Intrinsic fist  Full fist to 4 cm foam block - then 3 cm then 2 cm if can   Pain only <2/10  Opposition pick up 1cm foam block - pad on pad  AROM thumb PA and RA   10 reps  2  X day

## 2015-08-15 NOTE — Therapy (Signed)
[facility]ICINE [address], [place], [address] Phone: [phone]   Fax:  [phone]  Occupational Therapy Treatment  Patient Details  Name: Kayla Copeland MRN: [id number] Date of Birth: 28-Oct-1945 Referring Provider: Louanne Skye  Encounter Date: 08/15/2015      OT End of Session - 08/15/15 1649    Visit Number 1   Number of Visits 8   Date for OT Re-Evaluation 09/12/15   OT Start Time 1300   OT Stop Time 1358   OT Time Calculation (min) 58 min   Activity Tolerance Patient tolerated treatment well   Behavior During Therapy [facility] for tasks assessed/performed      Past Medical History  Diagnosis Date  . Hypertension   . Hypercholesteremia     Past Surgical History  Procedure Laterality Date  . Leg surgery Left   . Abdominal hysterectomy    . Tonsillectomy    . Appendectomy    . Leg surgery      x4    There were no vitals filed for this visit.  Visit Diagnosis:  Stiffness of hand joint, right - Plan: Ot plan of care cert/re-cert  Pain of right hand - Plan: Ot plan of care cert/re-cert  Muscle weakness - Plan: Ot plan of care cert/re-cert      Subjective Assessment - 08/15/15 1638    Subjective  I fell on my porch 06/29/15 - was in soft cast and then this splint  that hand MD put on until now - said you will make me a smaller one    Patient Stated Goals Want to use my hand again - do quilt, crochet, use computer , work in yard, cut food, and grasp objects around the house -    Currently in Pain? No/denies            [facility] OT Assessment - 08/15/15 0001    Assessment   Diagnosis R 3rd MC neck close fx    Referring Provider Mithani   Onset Date 06/29/15   Assessment Pt was in cast and then in prefab forearm brace that immoblilized 2dn and 3rd - pt repor increase pain at thumb    Precautions   Precautions Other (comment)  not lifting more than 10lbs    Balance Screen   Has the patient fallen  in the past 6 months Yes   How many times? 2   Has the patient had a decrease in activity level because of a fear of falling?  No   Is the patient reluctant to leave their home because of a fear of falling?  No   Home  Environment   Lives With Spouse   Prior Function   Level of Independence Independent   Leisure Disability ,  R hand dominant but very active work in yard, English as a second language teacher, quilt, cook and do own cleaning at home ,  work on Animator and read book and on tablet    AROM   Right Wrist Extension 23 Degrees   Right Wrist Flexion 35 Degrees   Right Wrist Radial Deviation 20 Degrees   Right Wrist Ulnar Deviation 28 Degrees   Right Hand AROM   R Thumb MCP 0-60 48 Degrees   R Thumb IP 0-80 75 Degrees   R Thumb Radial ABduction/ADduction 0-55 50   R Thumb Palmar ABduction/ADduction 0-45 40   R Index  MCP 0-90 50 Degrees   R Index PIP 0-100 72 Degrees   R Long  MCP 0-90 40 Degrees   R Long PIP 0-100 80 Degrees   R Ring  MCP 0-90 60 Degrees   R Ring PIP 0-100 100 Degrees   R Little  MCP 0-90 60 Degrees   R Little PIP 0-100 100 Degrees      DId fluido with AROM prior to review of HEP  Fabricated radial gutter splint for 2nd  and 3rd - PIP 's free and hand base  Thumb free - pt showed increase pain and stiffness at thumb                     OT Education - 08/15/15 1648    Education provided Yes   Education Details HEP   Person(s) Educated Patient   Methods Explanation;Demonstration;Tactile cues;Verbal cues;Handout   Comprehension Returned demonstration;Verbalized understanding;Verbal cues required          OT Short Term Goals - 08/15/15 1657    OT SHORT TERM GOAL #1   Title Pt to be ind in HEP and show improvement in flexion of 2nd and 3rd digits    Baseline ROM impaired and very little knowledge o nHEP    Time 2   Period Weeks   Status New   OT SHORT TERM GOAL #2   Title Thumb AROM improve to WNL and no pain to do buttons and write    Baseline complain  of thumb pain and thumb rotated during opposition and limtied to 3rd thru 5th -    Time 3   Period Weeks   Status New   OT SHORT TERM GOAL #3   Title Pain on PRWHE improve with 8 points    Baseline PRWHE for pain 16/50   Time 3   Period Weeks   Status New           OT Long Term Goals - 08/15/15 1700    OT LONG TERM GOAL #1   Title R hand digits flexion improve to touching palm to use in ADL's    Baseline Flexion impaired see flowsheet   Time 4   Period Weeks   Status New   OT LONG TERM GOAL #2   Title Function on PRWHE improve with at least 25 points    Baseline Function on PRWHE at eval 41/50   Time 4   Period Weeks   Status New               Plan - 08/15/15 1650    Clinical Impression Statement Pt present 61/2 wks out of R 3rd MC neck close fracture - was immobilized until last week - and now refer for hand base radial gutter splint for 2dn adn 3rd - as well as ROM with 10 lbs limit - pt present with decrease ROM at digits , decrease strength - also show decrease ROM and increase pain at thumb - pt unclear if wrist and thumb  had more ROM prior to accident - she had as child extensive surgery to R hand with medial N  impairment - pt is R hand dominant and prior level used in  extensively in  ADL"s and IADL's    Pt will benefit from skilled therapeutic intervention in order to improve on the following deficits (Retired) Decreased coordination;Decreased range of motion;Impaired flexibility;Impaired sensation;Pain;Impaired UE functional use;Decreased strength   Rehab Potential Good   OT Frequency 2x / week   OT Duration 4 weeks   OT Treatment/Interventions Self-care/ADL training;Passive range of motion;Fluidtherapy;Parrafin;Therapeutic exercises;Manual Therapy;Splinting;Patient/family education  Plan Assess splint use , HEP , progress   OT Home Exercise Plan see pt instruction   Consulted and Agree with Plan of Care Patient        Problem List There are no  active problems to display for this patient.   Oletta Cohn OTR/L,CLT  08/15/2015, 5:14 PM  [facility] PHYSICAL AND SPORTS MEDICINE [address] [place], [address] Phone: [phone]   Fax:  [phone]  Name: Kayla Copeland MRN: [id number] Date of Birth: 02-20-1946

## 2015-08-23 ENCOUNTER — Ambulatory Visit: Payer: BLUE CROSS/BLUE SHIELD | Admitting: Occupational Therapy

## 2015-08-23 DIAGNOSIS — M79641 Pain in right hand: Secondary | ICD-10-CM

## 2015-08-23 DIAGNOSIS — M25641 Stiffness of right hand, not elsewhere classified: Secondary | ICD-10-CM

## 2015-08-23 DIAGNOSIS — M6281 Muscle weakness (generalized): Secondary | ICD-10-CM

## 2015-08-23 NOTE — Patient Instructions (Addendum)
  Add composite fist PROM - flexion of 2nd and 3rd   And then teal putty for grip , lat and 3 point - as well as rolling - pinch alternate digits  Rubber band for extention of 2nd and 3rd

## 2015-08-23 NOTE — Therapy (Signed)
[place] [facility] PHYSICAL [facility] SPORTS MEDICINE [address], [place], [address] Phone: [phone]   Fax:  [phone]  Occupational Therapy Treatment  Patient Details  Name: Kayla Copeland MRN: [id number] Date of Birth: 1945-11-06 Referring Provider: Louanne Skye  Encounter Date: 08/23/2015      OT End of Session - 08/23/15 1025    Visit Number 2   Number of Visits 8   Date for OT Re-Evaluation 09/12/15   OT Start Time 0935   OT Stop Time 1020   OT Time Calculation (min) 45 min   Activity Tolerance Patient tolerated treatment well   Behavior During Therapy [facility] for tasks assessed/performed      Past Medical History  Diagnosis Date  . Hypertension   . Hypercholesteremia     Past Surgical History  Procedure Laterality Date  . Leg surgery Left   . Abdominal hysterectomy    . Tonsillectomy    . Appendectomy    . Leg surgery      x4    There were no vitals filed for this visit.  Visit Diagnosis:  Stiffness of hand joint, right  Pain of right hand  Muscle weakness      Subjective Assessment - 08/23/15 0935    Subjective  Fisting is better - and I am using it more - thumb stil bothering but don't know how much I could before -wear splint at night time and when moving around    Patient Stated Goals Want to use my hand again - do quilt, crochet, use computer , work in yard, cut food, and grasp objects around the house -    Currently in Pain? No/denies            [facility] OT Assessment - 08/23/15 0001    Strength   Right Hand Grip (lbs) 15   Right Hand Lateral Pinch 10 lbs   Right Hand 3 Point Pinch 4 lbs   Left Hand Grip (lbs) 32   Left Hand Lateral Pinch 12 lbs   Left Hand 3 Point Pinch 9 lbs   Right Hand AROM   R Thumb MCP 0-60 55 Degrees   R Thumb IP 0-80 85 Degrees   R Thumb Radial ABduction/ADduction 0-55 52   R Thumb Palmar ABduction/ADduction 0-45 45   R Index  MCP 0-90 65 Degrees   R Index PIP 0-100 100 Degrees   R Long  MCP 0-90 65 Degrees   R Long PIP 0-100 100 Degrees   R Ring  MCP 0-90 75 Degrees   R Little  MCP 0-90 85 Degrees       Fluido therapy for 12 min - with AROM for digits at [facility] after measurements for ROM and grip /prehension   Gentle PROM for R 2nd and 3rd MC - 10 reps each  Composite fisting PROM  Tendon glides -  Each step separate  Teal putty for grip , 3 point and lateral grip( need to focus to keep thumb straight  - also can alternate pinching - after rolling of putty (cueing for lightly rolling to decrease pain )   10 reps each  Rubber band for 2nd and 3rd digits extention  - but need to keep it achor with L hand - thumb unable to PA ( old injury)                       OT Education - 08/23/15 1025    Education provided  Yes   Education Details HEP   Person(s) Educated Patient   Methods Explanation;Demonstration;Tactile cues;Verbal cues;Handout   Comprehension Verbal cues required;Returned demonstration;Verbalized understanding          OT Short Term Goals - 08/15/15 1657    OT SHORT TERM GOAL #1   Title Pt to be ind in HEP and show improvement in flexion of 2nd and 3rd digits    Baseline ROM impaired and very little knowledge o nHEP    Time 2   Period Weeks   Status New   OT SHORT TERM GOAL #2   Title Thumb AROM improve to WNL and no pain to do buttons and write    Baseline complain of thumb pain and thumb rotated during opposition and limtied to 3rd thru 5th -    Time 3   Period Weeks   Status New   OT SHORT TERM GOAL #3   Title Pain on PRWHE improve with 8 points    Baseline PRWHE for pain 16/50   Time 3   Period Weeks   Status New           OT Long Term Goals - 08/15/15 1700    OT LONG TERM GOAL #1   Title R hand digits flexion improve to touching palm to use in ADL's    Baseline Flexion impaired see flowsheet   Time 4   Period Weeks   Status New   OT LONG TERM GOAL #2   Title Function on PRWHE improve with at least 25  points    Baseline Function on PRWHE at eval 41/50   Time 4   Period Weeks   Status New               Plan - 08/23/15 1026    Clinical Impression Statement Pt 71/2 wks postop - showed great progress in ROM - and did assess grip and prehension strength -  pt unclear what she could do prior because of old med N injury - pt HEP upgraded and initiated some strenghtening this date   Pt will benefit from skilled therapeutic intervention in order to improve on the following deficits (Retired) Decreased coordination;Decreased range of motion;Impaired flexibility;Impaired sensation;Pain;Impaired UE functional use;Decreased strength   Rehab Potential Good   Clinical Impairments Affecting Rehab Potential Old med N injury   OT Frequency 2x / week   OT Duration 4 weeks   OT Treatment/Interventions Self-care/ADL training;Passive range of motion;Fluidtherapy;Parrafin;Therapeutic exercises;Manual Therapy;Splinting;Patient/family education   Plan splint can be dischare at night time this weekend - and assess progress    OT Home Exercise Plan see pt instruction   Consulted and Agree with Plan of Care Patient        Problem List There are no active problems to display for this patient.   Oletta CohnuPreez, Armandina Iman OTR/L,CLT  08/23/2015, 10:29 AM  [place] [facility] [address] [place], [place], [id number] Phone: [phone]   Fax:  [phone]  Name: Kayla Copeland MRN: [id number] Date of Birth: 1945/07/08

## 2015-08-25 ENCOUNTER — Encounter: Payer: Medicare Other | Admitting: Occupational Therapy

## 2015-08-31 ENCOUNTER — Ambulatory Visit: Payer: BLUE CROSS/BLUE SHIELD | Admitting: Occupational Therapy

## 2015-08-31 DIAGNOSIS — M79641 Pain in right hand: Secondary | ICD-10-CM

## 2015-08-31 DIAGNOSIS — M25641 Stiffness of right hand, not elsewhere classified: Secondary | ICD-10-CM | POA: Diagnosis not present

## 2015-08-31 DIAGNOSIS — M6281 Muscle weakness (generalized): Secondary | ICD-10-CM

## 2015-08-31 NOTE — Therapy (Signed)
[place] [facility] SPORTS MEDICINE [address] [place], [address] Phone: [phone]   Fax:  [phone]  Occupational Therapy Treatment  Patient Details  Name: Kayla Copeland MRN: [id number] Date of Birth: 02-Sep-1945 Referring Provider: Gevena Barre  Encounter Date: 08/31/2015      OT End of Session - 08/31/15 1209    Visit Number 3   Number of Visits 3   Date for OT Re-Evaluation 08/31/15   OT Start Time 1016   OT Stop Time 1040   OT Time Calculation (min) 24 min   Activity Tolerance Patient tolerated treatment well   Behavior During Therapy [facility] for tasks assessed/performed      Past Medical History  Diagnosis Date  . Hypertension   . Hypercholesteremia     Past Surgical History  Procedure Laterality Date  . Leg surgery Left   . Abdominal hysterectomy    . Tonsillectomy    . Appendectomy    . Leg surgery      x4    There were no vitals filed for this visit.  Visit Diagnosis:  Stiffness of hand joint, right  Pain of right hand  Muscle weakness      Subjective Assessment - 08/31/15 1202    Subjective  I am doing good - I think this is the best my fist is going to get - doing most everyting maybe here and there little pain but not more than 1/10 -  only thing not doing is picking up anything heavy and wearing splint only when out and about moving -   Patient Stated Goals Want to use my hand again - do quilt, crochet, use computer , work in yard, cut food, and grasp objects around the house -    Currently in Pain? No/denies            [facility] OT Assessment - 08/31/15 0001    Strength   Right Hand Grip (lbs) 21   Right Hand Lateral Pinch 12 lbs   Right Hand 3 Point Pinch 5 lbs   Left Hand Grip (lbs) 32   Left Hand Lateral Pinch 12 lbs   Left Hand 3 Point Pinch 9 lbs   Right Hand AROM   R Index  MCP 0-90 70 Degrees   R Index PIP 0-100 100 Degrees   R Long  MCP 0-90 75 Degrees   R Long PIP 0-100 100  Degrees                  OT Treatments/Exercises (OP) - 08/31/15 0001    ADLs   ADL Comments PRWHE done and simulated    Hand Exercises   Other Hand Exercises measurements takend for digts and grip - pt can do 1 -2 lbs weight for wrist , elbow and shoulder -                 OT Education - 08/31/15 1209    Education provided Yes   Education Details HEP   Person(s) Educated Patient   Methods Explanation;Demonstration;Tactile cues;Verbal cues   Comprehension Verbal cues required;Returned demonstration;Verbalized understanding          OT Short Term Goals - 08/31/15 1303    OT SHORT TERM GOAL #1   Title Pt to be ind in HEP and show improvement in flexion of 2nd and 3rd digits    Status Achieved   OT SHORT TERM GOAL #2   Title Thumb AROM improve to WNL and  no pain to do buttons and write    Baseline Back to prior level   Status Achieved   OT SHORT TERM GOAL #3   Title Pain on PRWHE improve with 8 points    Baseline PRWHE pain improve from 16/50 -to 1/50    Status Achieved           OT Long Term Goals - 08/31/15 1304    OT LONG TERM GOAL #1   Title R hand digits flexion improve to touching palm to use in ADL's    Baseline see flowsheet - back to prior level    Status Achieved   OT LONG TERM GOAL #2   Title Function on PRWHE improve with at least 25 points    Baseline Function on PRWHE improve from 41/50 - to 5/50    Status Achieved               Plan - 08/31/15 1301    Clinical Impression Statement Pt made great progress in ROM , grip in R hand - pt has history of nerve injury and feels like she is about back to prior level - pt uisng hand in most everything exept lifting heavy ojbects - function improve from 41/50 to 5/50 - pt met all goals - pt to still wear splint when out and about until appt with MD - pt discharge from hand therapy    Rehab Potential Good   Clinical Impairments Affecting Rehab Potential Old med N injury   OT  Treatment/Interventions Self-care/ADL training;Passive range of motion;Fluidtherapy;Parrafin;Therapeutic exercises;Manual Therapy;Splinting;Patient/family education   Plan discharge and cont with splint and HEP until appt with MD    OT Home Exercise Plan see pt instruction   Consulted and Agree with Plan of Care Patient        Problem List There are no active problems to display for this patient.   Rosalyn Gess OTR/L,CLT  08/31/2015, 1:05 PM  [place] PH[facility] AND SPORTS MEDICINE [address] [place], [address] Phone: [phone]   Fax:  [phone]  Name: Kayla Copeland MRN: [id number] Date of Birth: 15-Nov-1945

## 2015-08-31 NOTE — Patient Instructions (Signed)
Pt to cont with HEP and putty until follow up with MD next week - splint wearing - another week of strengthening and can discharge splint per MD - at that appt probably

## 2017-08-21 ENCOUNTER — Ambulatory Visit
Admission: RE | Admit: 2017-08-21 | Discharge: 2017-08-21 | Disposition: A | Discharge status: Home / Self Care | Payer: BLUE CROSS/BLUE SHIELD | Source: Ambulatory Visit | Attending: Family Medicine | Admitting: Family Medicine

## 2017-08-21 ENCOUNTER — Other Ambulatory Visit: Payer: Self-pay | Admitting: Family Medicine

## 2017-08-21 DIAGNOSIS — R52 Pain, unspecified: Secondary | ICD-10-CM

## 2017-08-21 DIAGNOSIS — G8929 Other chronic pain: Secondary | ICD-10-CM | POA: Insufficient documentation

## 2017-08-21 DIAGNOSIS — M25512 Pain in left shoulder: Secondary | ICD-10-CM | POA: Diagnosis not present

## 2017-11-27 ENCOUNTER — Other Ambulatory Visit: Payer: Self-pay | Admitting: Family Medicine

## 2017-11-27 DIAGNOSIS — Z780 Asymptomatic menopausal state: Secondary | ICD-10-CM

## 2020-03-05 ENCOUNTER — Ambulatory Visit
Admission: EM | Admit: 2020-03-05 | Discharge: 2020-03-05 | Disposition: A | Discharge status: Home / Self Care | Payer: Medicare PPO | Attending: Emergency Medicine | Admitting: Emergency Medicine

## 2020-03-05 ENCOUNTER — Other Ambulatory Visit: Payer: Self-pay

## 2020-03-05 ENCOUNTER — Encounter: Payer: Self-pay | Admitting: Emergency Medicine

## 2020-03-05 DIAGNOSIS — N390 Urinary tract infection, site not specified: Secondary | ICD-10-CM | POA: Diagnosis present

## 2020-03-05 LAB — URINALYSIS, COMPLETE (UACMP) WITH MICROSCOPIC
Bilirubin Urine: NEGATIVE
Glucose, UA: NEGATIVE mg/dL
Hgb urine dipstick: NEGATIVE
Nitrite: POSITIVE — AB
Protein, ur: NEGATIVE mg/dL
Specific Gravity, Urine: 1.025 (ref 1.005–1.030)
pH: 5.5 (ref 5.0–8.0)

## 2020-03-05 MED ORDER — SULFAMETHOXAZOLE-TRIMETHOPRIM 800-160 MG PO TABS: 1.0000 | ORAL_TABLET | Freq: Two times a day (BID) | ORAL | 0 refills | Status: DC

## 2020-03-05 NOTE — ED Triage Notes (Signed)
Patient c/o lower abdominal pressure and lower back pain for the past 5 days.  Patient denies fevers.  Patient denies N/V.

## 2020-03-05 NOTE — Discharge Instructions (Addendum)
Stop your daily prophylaxis antibiotic. Start Bactrim DS 1 tablet twice daily with a full glass of water for 10 days. We will culture your urine and change the antibiotic if we need to.  Follow-up with your PCP and urology.

## 2020-03-05 NOTE — ED Provider Notes (Signed)
[facility]    CSN: [id number] Arrival date & time: 03/05/20  1531      History   Chief Complaint Chief Complaint  Patient presents with   Back Pain   Abdominal Pain    HPI Kayla Copeland is a 74 y.o. female.   73 yo female here for evaluation of low back and suprapubic pain x 5 days.  She has this pattern happen every 6 months and it is usually an e. Coli UTI. She takes Bactrim SS daily as preventative.     Past Medical History:  Diagnosis Date   Hypercholesteremia    Hypertension     There are no problems to display for this patient.   Past Surgical History:  Procedure Laterality Date   ABDOMINAL HYSTERECTOMY     APPENDECTOMY     LEG SURGERY Left    LEG SURGERY     x4   TONSILLECTOMY      OB History   No obstetric history on file.      Home Medications    Prior to Admission medications   Medication Sig Start Date End Date Taking? Authorizing Provider  amLODipine (NORVASC) 10 MG tablet Take 10 mg by mouth daily.   Yes [provider]  citalopram (CELEXA) 20 MG tablet Take 20 mg by mouth daily.   Yes [provider]  lisinopril (PRINIVIL,ZESTRIL) 40 MG tablet Take 40 mg by mouth daily.   Yes [provider]  potassium chloride (K-DUR,KLOR-CON) 10 MEQ tablet Take 10 mEq by mouth 4 (four) times daily.   Yes [provider]  simvastatin (ZOCOR) 20 MG tablet Take 20 mg by mouth daily.   Yes [provider]  sulfamethoxazole-trimethoprim (BACTRIM DS,SEPTRA DS) 800-160 MG tablet Take 1 tablet by mouth 2 (two) times daily. 06/03/15  Yes Lutricia Feil, PA-C  vitamin B-12 (CYANOCOBALAMIN) 500 MCG tablet Take 500 mcg by mouth daily.   Yes [provider]  vitamin E 1000 UNIT capsule Take 1,000 Units by mouth daily.   Yes [provider]  amLODipine (NORVASC) 10 MG tablet Take 10 mg by mouth daily.    [provider]  phenazopyridine (PYRIDIUM) 200 MG tablet Take 1  tablet (200 mg total) by mouth 3 (three) times daily. 06/03/15   Lutricia Feil, PA-C  sulfamethoxazole-trimethoprim (BACTRIM DS) 800-160 MG tablet Take 1 tablet by mouth 2 (two) times daily for 10 days. 03/05/20 03/15/20  Becky Augusta, NP    Family History Family History  Problem Relation Age of Onset   Coronary artery disease Mother    Hypertension Mother    Coronary artery disease Father    Hypertension Father    Cancer Sister    Cancer Brother     Social History Social History   Tobacco Use   Smoking status: Never Smoker   Smokeless tobacco: Never Used  Building services engineer Use: Never used  Substance Use Topics   Alcohol use: No   Drug use: Never     Allergies   Nafcillin, Naproxen, and Other   Review of Systems Review of Systems  Constitutional: Negative for activity change, appetite change, fatigue and fever.  HENT: Negative for congestion, rhinorrhea and sore throat.   Respiratory: Negative for cough.   Cardiovascular: Negative for chest pain.  Gastrointestinal: Positive for abdominal pain. Negative for constipation, diarrhea, nausea and vomiting.  Genitourinary: Positive for flank pain. Negative for difficulty urinating, dysuria, frequency, hematuria and urgency.  Musculoskeletal: Positive for back  pain. Negative for arthralgias and myalgias.  Skin: Negative.   Neurological: Positive for dizziness. Negative for syncope.  Hematological: Negative.   Psychiatric/Behavioral: Negative.      Physical Exam Triage Vital Signs ED Triage Vitals  Enc Vitals Group     BP 03/05/20 1550 135/76     Pulse Rate 03/05/20 1550 82     Resp 03/05/20 1550 14     Temp 03/05/20 1550 98.2 F (36.8 C)     Temp Source 03/05/20 1550 Oral     SpO2 03/05/20 1550 95 %     Weight 03/05/20 1546 217 lb (98.4 kg)     Height 03/05/20 1546 5\' 7"  (1.702 m)     Head Circumference --      Peak Flow --      Pain Score 03/05/20 1546 5     Pain Loc --      Pain Edu? --       Excl. in GC? --    No data found.  Updated Vital Signs BP 135/76 (BP Location: Right Arm)    Pulse 82    Temp 98.2 F (36.8 C) (Oral)    Resp 14    Ht 5\' 7"  (1.702 m)    Wt 217 lb (98.4 kg)    SpO2 95%    BMI 33.99 kg/m   Visual Acuity Right Eye Distance:   Left Eye Distance:   Bilateral Distance:    Right Eye Near:   Left Eye Near:    Bilateral Near:     Physical Exam Vitals and nursing note reviewed.  Constitutional:      Appearance: She is well-developed.  HENT:     Head: Normocephalic and atraumatic.  Eyes:     Extraocular Movements: Extraocular movements intact.     Pupils: Pupils are equal, round, and reactive to light.  Cardiovascular:     Rate and Rhythm: Normal rate and regular rhythm.     Heart sounds: Normal heart sounds.  Pulmonary:     Effort: Pulmonary effort is normal.     Breath sounds: Normal breath sounds.  Abdominal:     General: Abdomen is protuberant. Bowel sounds are normal.     Palpations: Abdomen is soft.     Tenderness: There is abdominal tenderness in the suprapubic area. There is right CVA tenderness. There is no left CVA tenderness or guarding.  Skin:    General: Skin is warm and dry.     Capillary Refill: Capillary refill takes less than 2 seconds.  Neurological:     General: No focal deficit present.     Mental Status: She is oriented to person, place, and time.  Psychiatric:        Mood and Affect: Mood normal.        Behavior: Behavior normal.      UC Treatments / Results  Labs (all labs ordered are listed, but only abnormal results are displayed) Labs Reviewed  URINALYSIS, COMPLETE (UACMP) WITH MICROSCOPIC - Abnormal; Notable for the following components:      Result Value   APPearance HAZY (*)    Ketones, ur TRACE (*)    Nitrite POSITIVE (*)    Leukocytes,Ua TRACE (*)    Bacteria, UA MANY (*)    All other components within normal limits  URINE CULTURE    EKG   Radiology No results found.  Procedures Procedures  (including critical care time)  Medications Ordered in UC Medications - No data to display  Initial  Impression / Assessment and Plan / UC Course  I have reviewed the triage vital signs and the nursing notes.  Pertinent labs & imaging results that were available during my care of the patient were reviewed by me and considered in my medical decision making (see chart for details).   Patient is here for evaluation of suprapubic pain and back pain x 5 days. She has not had a fever, N/V/D, or urinary symptoms. She has this same pattern every 6 months caused by an e. Coli UTI. Pt is on Bactrim SS as a prophylactic. She has not had any recent diarrhea or contributing symptoms that she is aware of.   She was on 10 days Bactrim DS for her last infection. Will culture urine and treat her with same.  Final Clinical Impressions(s) / UC Diagnoses   Final diagnoses:  Lower urinary tract infectious disease     Discharge Instructions     Stop your daily prophylaxis antibiotic. Start Bactrim DS 1 tablet twice daily with a full glass of water for 10 days. We will culture your urine and change the antibiotic if we need to.  Follow-up with your PCP and urology.    ED Prescriptions    Medication Sig Dispense Auth. Provider   sulfamethoxazole-trimethoprim (BACTRIM DS) 800-160 MG tablet Take 1 tablet by mouth 2 (two) times daily for 10 days. 20 tablet Becky Augusta, NP     PDMP not reviewed this encounter.   Becky Augusta, NP 03/05/20 1640

## 2020-03-09 ENCOUNTER — Telehealth (HOSPITAL_COMMUNITY): Payer: Self-pay | Admitting: Emergency Medicine

## 2020-03-09 LAB — URINE CULTURE
Culture: >=100000 — AB
Special Requests: NORMAL

## 2020-03-09 MED ORDER — NITROFURANTOIN MONOHYD MACRO 100 MG PO CAPS: 100.0000 mg | ORAL_CAPSULE | Freq: Two times a day (BID) | ORAL | 0 refills | Status: DC

## 2020-11-11 ENCOUNTER — Ambulatory Visit (INDEPENDENT_AMBULATORY_CARE_PROVIDER_SITE_OTHER): Payer: Medicare PPO

## 2020-11-11 ENCOUNTER — Ambulatory Visit
Admission: EM | Admit: 2020-11-11 | Discharge: 2020-11-11 | Disposition: A | Discharge status: Home / Self Care | Payer: Medicare PPO

## 2020-11-11 ENCOUNTER — Other Ambulatory Visit: Payer: Self-pay

## 2020-11-11 DIAGNOSIS — M25532 Pain in left wrist: Secondary | ICD-10-CM | POA: Diagnosis not present

## 2020-11-11 DIAGNOSIS — S52502A Unspecified fracture of the lower end of left radius, initial encounter for closed fracture: Secondary | ICD-10-CM

## 2020-11-11 DIAGNOSIS — W19XXXA Unspecified fall, initial encounter: Secondary | ICD-10-CM | POA: Diagnosis not present

## 2020-11-11 MED ORDER — TRAMADOL HCL 50 MG PO TABS: 50.0000 mg | ORAL_TABLET | Freq: Three times a day (TID) | ORAL | 0 refills | Status: DC | PRN

## 2020-11-11 NOTE — ED Provider Notes (Signed)
[facility]    CSN: [id number] Arrival date & time: 11/11/20  1516      History   Chief Complaint Chief Complaint  Patient presents with  . Wrist Pain    left    HPI Kayla Copeland is a 75 y.o. female.   Kayla Copeland presents with complaints of left wrist pain. Just prior to arrival she lost her balance and fell from standing, catching herself with outstretched left arm and hand. On landing it made her feel nauseous due to the severity of pain. She has had a left wrist fracture in the past and required casting, but no previous surgery to the wrist. No numbness or tingling. Unable to move left wrist. Swelling present. Denies any dizziness, headache, vision change, chest pain or palpitations prior to fall. No other injury or pain related to fall.    ROS per HPI, negative if not otherwise mentioned.      Past Medical History:  Diagnosis Date  . Hypercholesteremia   . Hypertension     There are no problems to display for this patient.   Past Surgical History:  Procedure Laterality Date  . ABDOMINAL HYSTERECTOMY    . APPENDECTOMY    . LEG SURGERY Left   . LEG SURGERY     x4  . TONSILLECTOMY      OB History   No obstetric history on file.      Home Medications    Prior to Admission medications   Medication Sig Start Date End Date Taking? Authorizing Provider  amLODipine (NORVASC) 2.5 MG tablet Take 2.5 mg by mouth daily. 09/15/20  Yes [provider]  Ascorbic Acid (VITAMIN C) 1000 MG tablet Take 1,000 mg by mouth daily.   Yes [provider]  atorvastatin (LIPITOR) 40 MG tablet Take 1 tablet by mouth daily. 09/14/20  Yes [provider]  buPROPion (WELLBUTRIN) 75 MG tablet Take 75 mg by mouth every morning. 09/18/20  Yes [provider]  cholecalciferol (VITAMIN D3) 25 MCG (1000 UNIT) tablet Take 1,000 Units by mouth daily.   Yes [provider]  citalopram (CELEXA) 20 MG tablet Take 20 mg by mouth daily.   Yes  [provider]  labetalol (NORMODYNE) 200 MG tablet Take 200 mg by mouth 2 (two) times daily. 09/01/20  Yes [provider]  lisinopril (PRINIVIL,ZESTRIL) 40 MG tablet Take 40 mg by mouth daily.   Yes [provider]  potassium chloride (K-DUR,KLOR-CON) 10 MEQ tablet Take 10 mEq by mouth 4 (four) times daily.   Yes [provider]  simvastatin (ZOCOR) 20 MG tablet Take 20 mg by mouth daily.   Yes [provider]  telmisartan (MICARDIS) 80 MG tablet Take 80 mg by mouth daily. 09/28/20  Yes [provider]  traMADol (ULTRAM) 50 MG tablet Take 1 tablet (50 mg total) by mouth every 8 (eight) hours as needed for severe pain. 11/11/20  Yes Nealy Karapetian, Barron Alvine, NP  vitamin B-12 (CYANOCOBALAMIN) 500 MCG tablet Take 500 mcg by mouth daily.   Yes [provider]  vitamin E 1000 UNIT capsule Take 1,000 Units by mouth daily.   Yes [provider]  Zinc Sulfate (ZINC-220 PO) Take by mouth.   Yes [provider]  amLODipine (NORVASC) 10 MG tablet Take 10 mg by mouth daily.    [provider]  amLODipine (NORVASC) 10 MG tablet Take 10 mg by mouth daily.    [provider]  nitrofurantoin, macrocrystal-monohydrate, (MACROBID) 100 MG  capsule Take 1 capsule (100 mg total) by mouth 2 (two) times daily. 03/09/20   Lamptey, Britta Mccreedy, MD  phenazopyridine (PYRIDIUM) 200 MG tablet Take 1 tablet (200 mg total) by mouth 3 (three) times daily. 06/03/15   Lutricia Feil, PA-C    Family History Family History  Problem Relation Age of Onset  . Coronary artery disease Mother   . Hypertension Mother   . Coronary artery disease Father   . Hypertension Father   . Cancer Sister   . Cancer Brother     Social History Social History   Tobacco Use  . Smoking status: Never Smoker  . Smokeless tobacco: Never Used  Vaping Use  . Vaping Use: Never used  Substance Use Topics  . Alcohol use: No  . Drug use: Never      Allergies   Estradiol, Nafcillin, Naproxen, and Other   Review of Systems Review of Systems   Physical Exam Triage Vital Signs ED Triage Vitals  Enc Vitals Group     BP 11/11/20 1610 124/85     Pulse Rate 11/11/20 1610 68     Resp 11/11/20 1610 18     Temp 11/11/20 1610 99.1 F (37.3 C)     Temp Source 11/11/20 1610 Oral     SpO2 11/11/20 1610 96 %     Weight 11/11/20 1605 212 lb (96.2 kg)     Height 11/11/20 1605 5\' 7"  (1.702 m)     Head Circumference --      Peak Flow --      Pain Score 11/11/20 1605 10     Pain Loc --      Pain Edu? --      Excl. in GC? --    No data found.  Updated Vital Signs BP 124/85 (BP Location: Left Arm)   Pulse 68   Temp 99.1 F (37.3 C) (Oral)   Resp 18   Ht 5\' 7"  (1.702 m)   Wt 212 lb (96.2 kg)   SpO2 96%   BMI 33.20 kg/m    Physical Exam Constitutional:      General: She is not in acute distress.    Appearance: She is well-developed.  Cardiovascular:     Rate and Rhythm: Normal rate.  Pulmonary:     Effort: Pulmonary effort is normal.  Musculoskeletal:     Left wrist: Swelling, tenderness and bony tenderness present. Decreased range of motion.     Comments: Left wrist with swelling and pain, particularly about the distal radius  Skin:    General: Skin is warm and dry.  Neurological:     Mental Status: She is alert and oriented to person, place, and time.      UC Treatments / Results  Labs (all labs ordered are listed, but only abnormal results are displayed) Labs Reviewed - No data to display  EKG   Radiology DG Wrist Complete Left  Result Date: 11/11/2020 CLINICAL DATA:  Pain following fall EXAM: LEFT WRIST - COMPLETE 3+ VIEW COMPARISON:  None. FINDINGS: Frontal, oblique, lateral, and ulnar deviation scaphoid images obtained. There is a comminuted fracture of the distal radial metaphysis with mild dorsal angulation distally and impaction at the fracture site. There is relative sclerosis involving lunate  with suggestion of potential fragmentation involving the lunate. 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 No other evident fracture. No dislocation. Underlying osteoporosis. There is narrowing of the first carpal-metacarpal  joint. IMPRESSION: 1. Comminuted fracture distal radial metaphysis with dorsal angulation distally and impaction at the fracture site. 2. Apparent fragmentation along the proximal lunate with sclerosis. 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 This finding may warrant CT of the wrist to further evaluate the lunate bone. 3. Underlying osteoporosis. 4. Osteoarthritic change first carpal-metacarpal joint. These results will be called to the ordering clinician or representative by the Radiologist Assistant, and communication documented in the PACS or [employer]. Electronically Signed   By: Bretta Bang III M.D.   On: 11/11/2020 16:30    Procedures Procedures (including critical care time)  Medications Ordered in UC Medications - No data to display  Initial Impression / Assessment and Plan / UC Course  I have reviewed the triage vital signs and the nursing notes.  Pertinent labs & imaging results that were available during my care of the patient were reviewed by me and considered in my medical decision making (see chart for details).    Left distal radius fracture s/p fall today, see radiology report. Secure chat message to Dr. Rosita Kea with [facility], who agrees with plan for sugar tong splint placement, follow up on Monday next week for recheck. Pain management discussed with patient and s/o. Patient verbalized understanding and agreeable to plan.   Final Clinical Impressions(s) / UC Diagnoses   Final diagnoses:  Closed fracture of distal end of left radius, unspecified fracture morphology, initial encounter     Discharge Instructions      Ice, elevation, tylenol to help with pain.  Keep splint in place.  Tramadol as needed for breakthrough pain. May cause drowsiness, may cause constipation.  Call Monday to follow up with orthopedics.     ED Prescriptions    Medication Sig Dispense Auth. Provider   traMADol (ULTRAM) 50 MG tablet Take 1 tablet (50 mg total) by mouth every 8 (eight) hours as needed for severe pain. 10 tablet Georgetta Haber, NP     I have reviewed the PDMP during this encounter.   Georgetta Haber, NP 11/11/20 1654

## 2020-11-11 NOTE — Discharge Instructions (Signed)
Ice, elevation, tylenol to help with pain.  Keep splint in place.  Tramadol as needed for breakthrough pain. May cause drowsiness, may cause constipation.  Call Monday to follow up with orthopedics.

## 2020-11-11 NOTE — ED Triage Notes (Signed)
Pt c/o fall earlier today at home. Pt reports she tried to stop herself and landed on her left wrist. The wrist is painful and swollen, with decreased ROM. Pt denies any other injuries.

## 2021-03-01 ENCOUNTER — Other Ambulatory Visit: Payer: Self-pay | Admitting: Family Medicine

## 2021-03-01 DIAGNOSIS — Z8781 Personal history of (healed) traumatic fracture: Secondary | ICD-10-CM

## 2021-03-01 DIAGNOSIS — Z780 Asymptomatic menopausal state: Secondary | ICD-10-CM

## 2021-03-03 ENCOUNTER — Other Ambulatory Visit: Payer: Self-pay | Admitting: Family Medicine

## 2021-03-03 DIAGNOSIS — R1011 Right upper quadrant pain: Secondary | ICD-10-CM

## 2021-03-03 DIAGNOSIS — R1110 Vomiting, unspecified: Secondary | ICD-10-CM

## 2021-03-08 ENCOUNTER — Ambulatory Visit
Admission: RE | Admit: 2021-03-08 | Discharge: 2021-03-08 | Disposition: A | Discharge status: Home / Self Care | Payer: Medicare PPO | Source: Ambulatory Visit | Attending: Family Medicine | Admitting: Family Medicine

## 2021-03-08 ENCOUNTER — Other Ambulatory Visit: Payer: Self-pay

## 2021-03-08 DIAGNOSIS — R1110 Vomiting, unspecified: Secondary | ICD-10-CM | POA: Insufficient documentation

## 2021-03-08 DIAGNOSIS — R1011 Right upper quadrant pain: Secondary | ICD-10-CM | POA: Insufficient documentation

## 2021-06-19 ENCOUNTER — Encounter (HOSPITAL_COMMUNITY): Payer: Self-pay | Admitting: Radiology

## 2023-03-30 IMAGING — CR DG WRIST COMPLETE 3+V*L*
4 series · 4 of 4 positions shown · non-contrast
Comparison: None.

CLINICAL DATA: Pain following fall

EXAM:
LEFT WRIST - COMPLETE 3+ VIEW

[wrist pa]
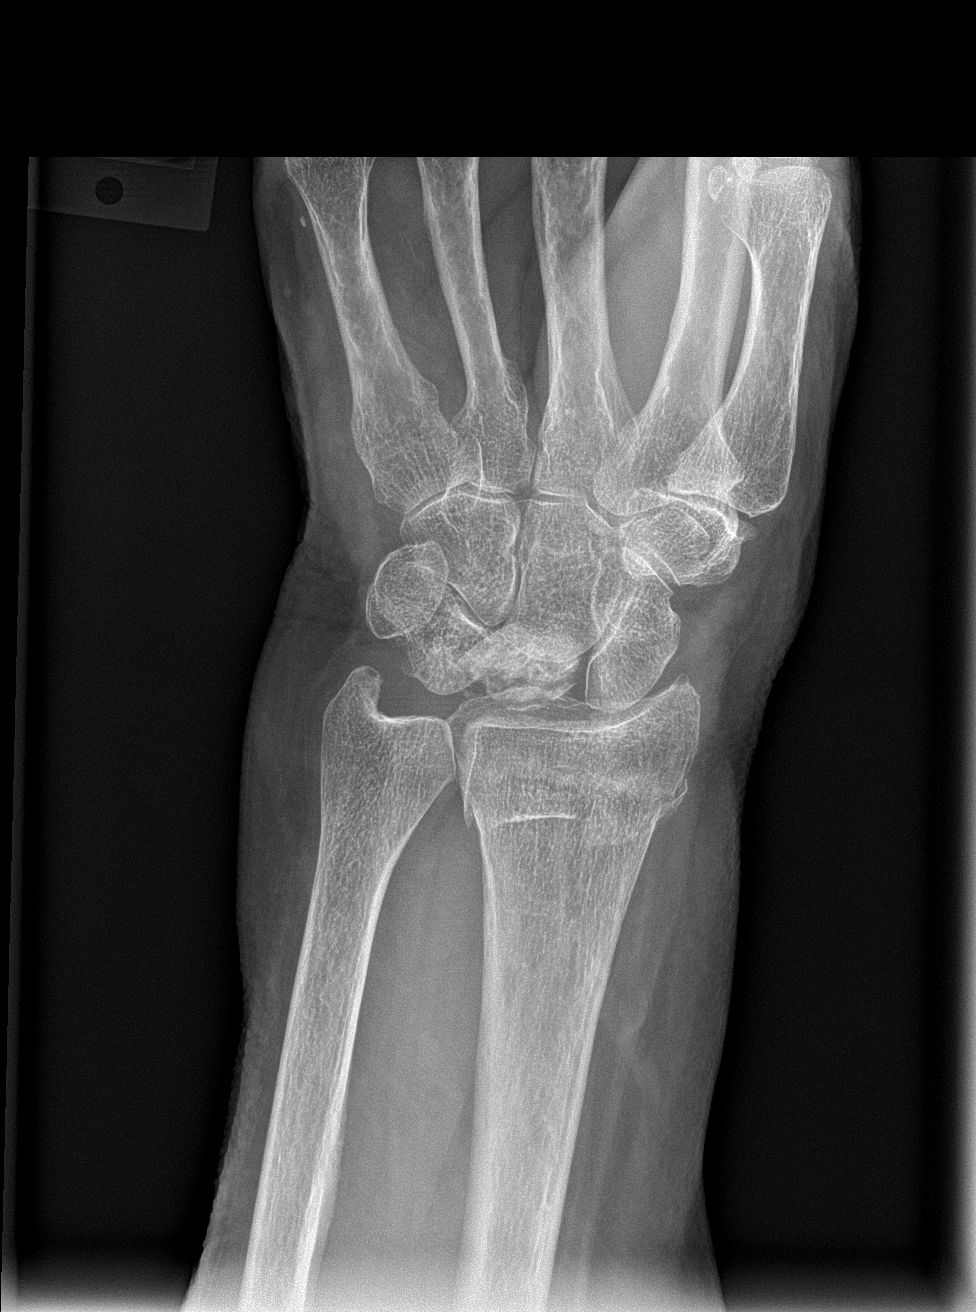

[wrist obl]
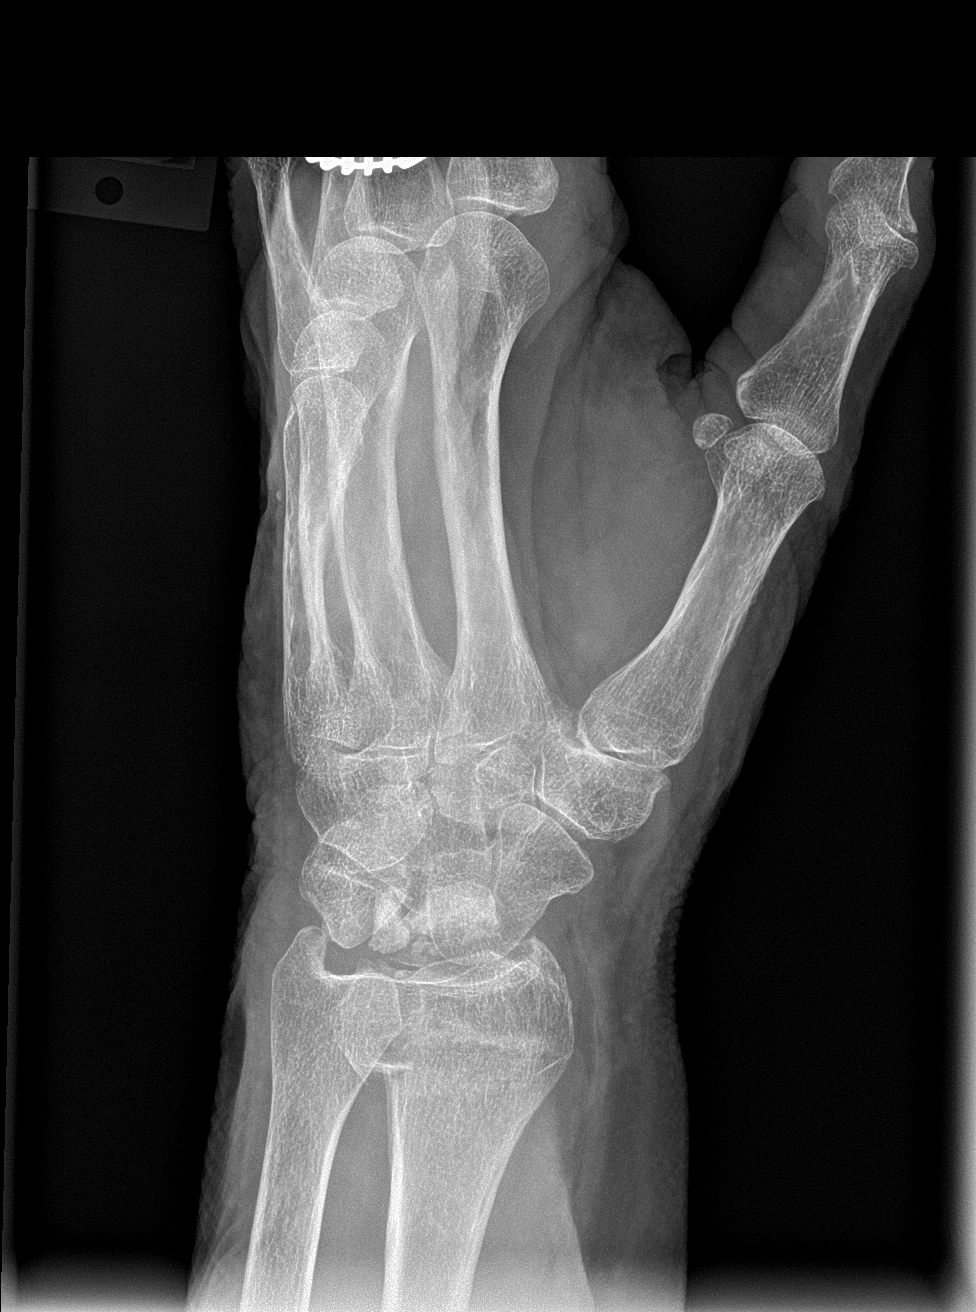

[wrist lat]
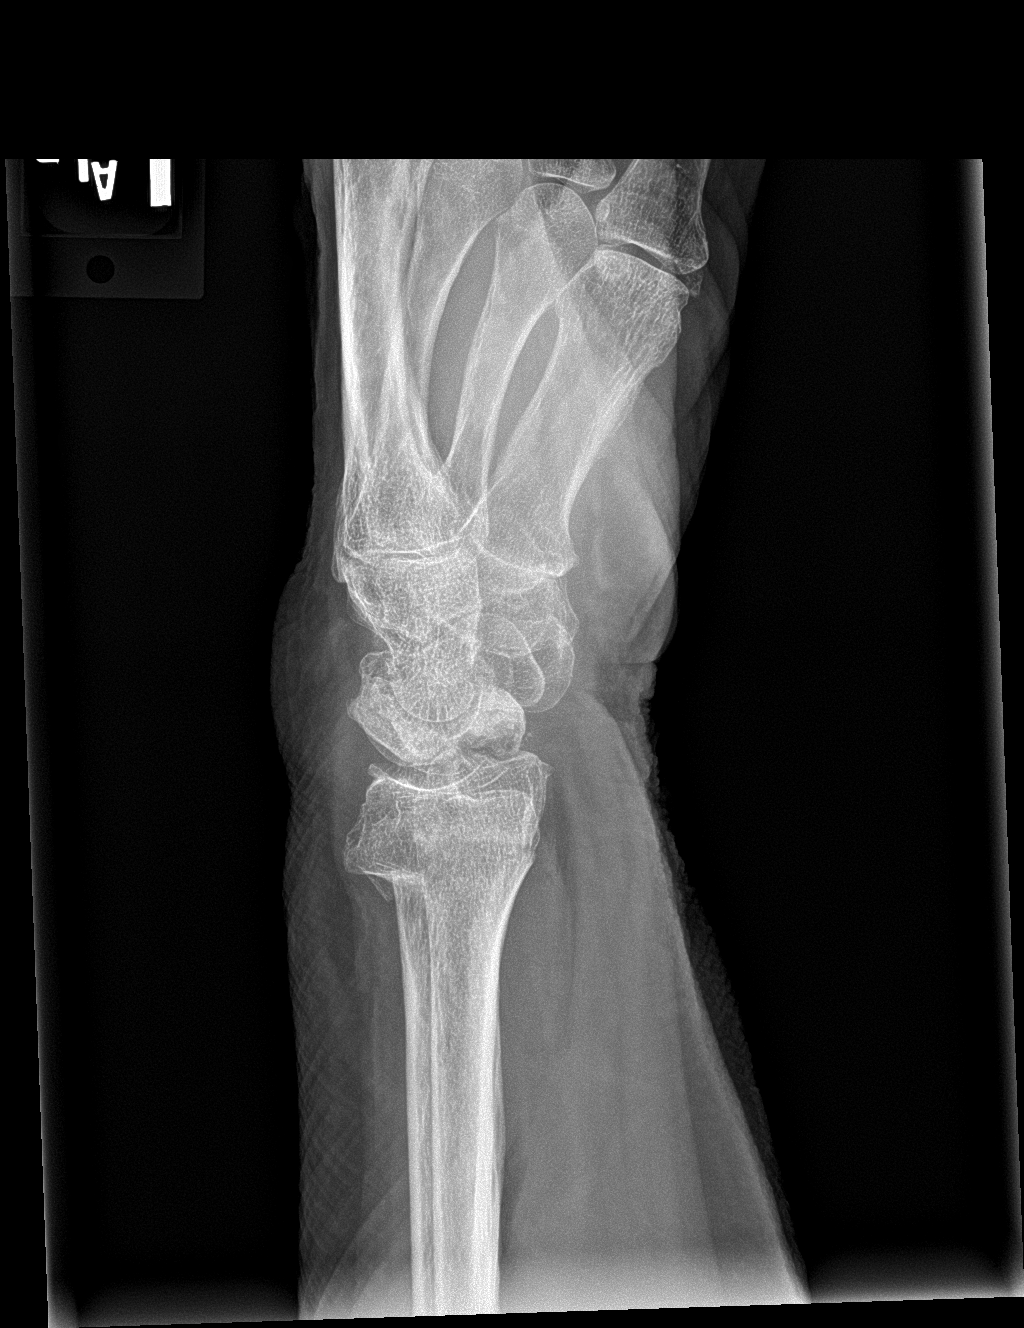

[wrist navicular]
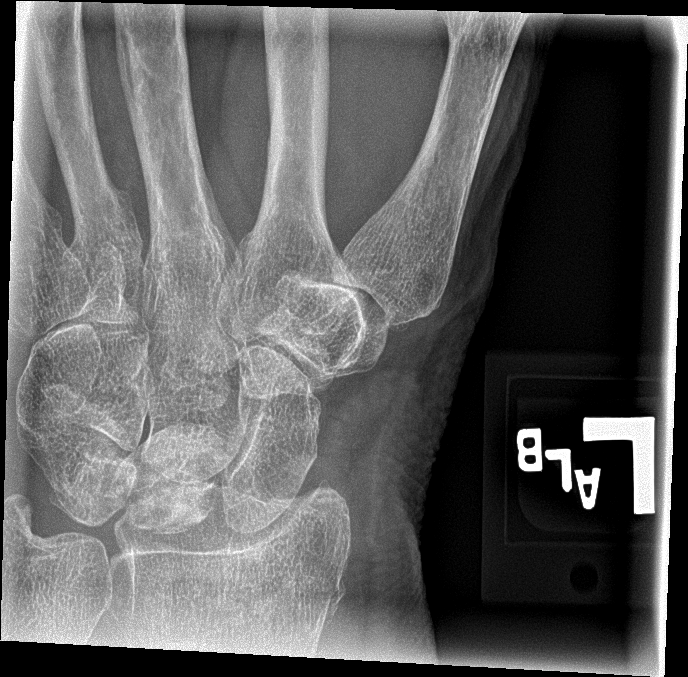

[4 of 4 positions shown; findings below may reference images not displayed]

FINDINGS: Frontal, oblique, lateral, and ulnar deviation scaphoid images
obtained. There is a comminuted fracture of the distal radial
metaphysis with mild dorsal angulation distally and impaction at the
fracture site.

There is relative sclerosis involving lunate with suggestion of
potential fragmentation involving the lunate. There is concern for
fracture involving the proximal lunate bone with potential
underlying avascular necrosis of the lunate.

No other evident fracture. No dislocation. Underlying osteoporosis.
There is narrowing of the first carpal-metacarpal joint.
IMPRESSION: 1. Comminuted fracture distal radial metaphysis with dorsal
angulation distally and impaction at the fracture site.
2. Apparent fragmentation along the proximal lunate with sclerosis.
Suspect fracture with underlying avascular necrosis of the lunate.
This finding may warrant CT of the wrist to further evaluate the
lunate bone.
3. Underlying osteoporosis.
4. Osteoarthritic change first carpal-metacarpal joint.

These results will be called to the ordering clinician or
representative by the Radiologist Assistant, and communication
documented in the PACS or [REDACTED].

## 2023-07-25 IMAGING — US US ABDOMEN LIMITED
1 series · 13 of 25 positions shown · non-contrast
Comparison: None.

CLINICAL DATA: Right upper quadrant pain, vomiting

EXAM:
ULTRASOUND ABDOMEN LIMITED RIGHT UPPER QUADRANT

[Series 1: us abdomen limited · 0.20mm/px · 13 of 33 slices shown]
[im 1/33]
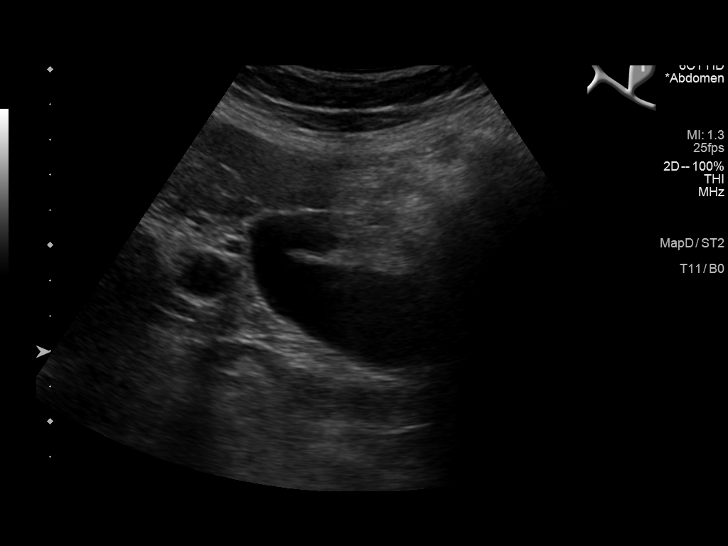
[im 3/33]
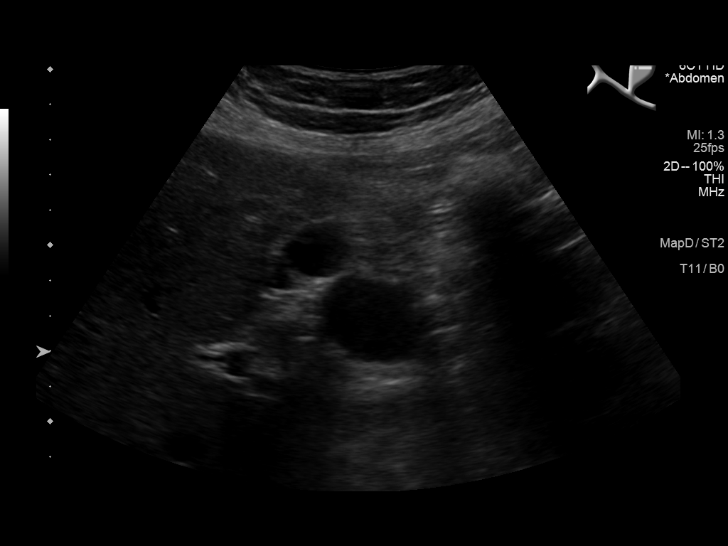
[im 6/33]
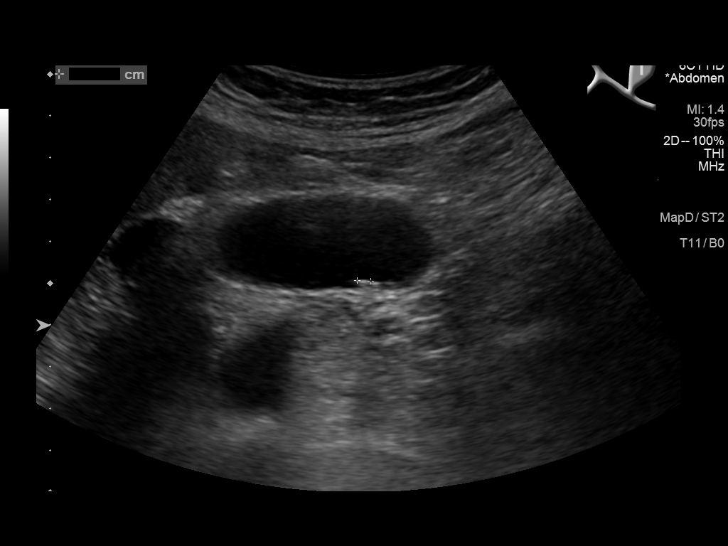
[im 9/33]
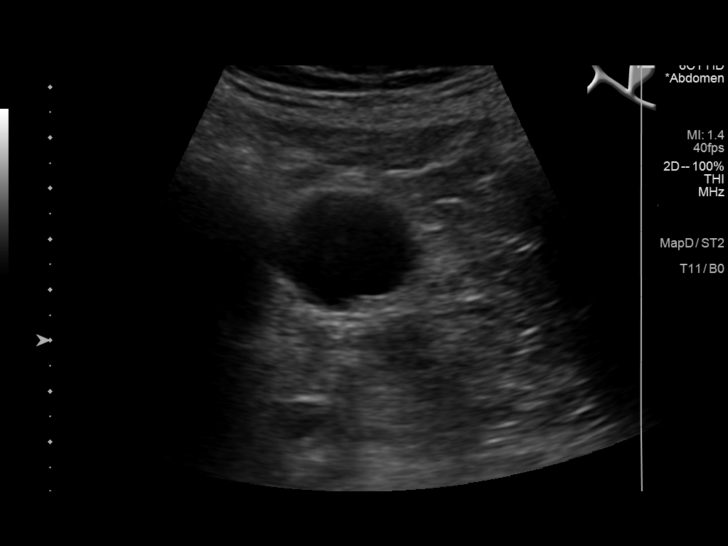
[im 11/33]
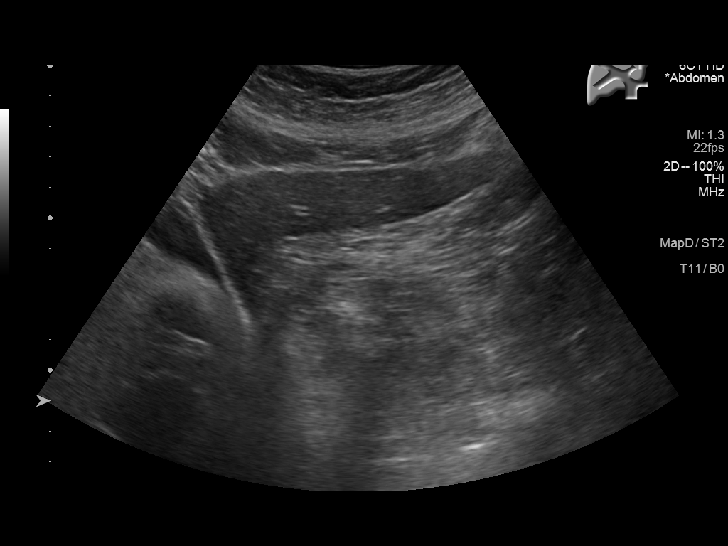
[im 14/33]
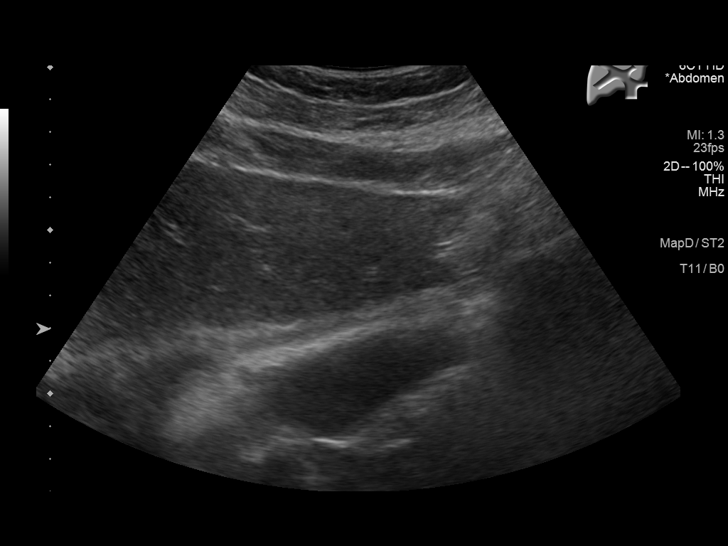
[im 17/33]
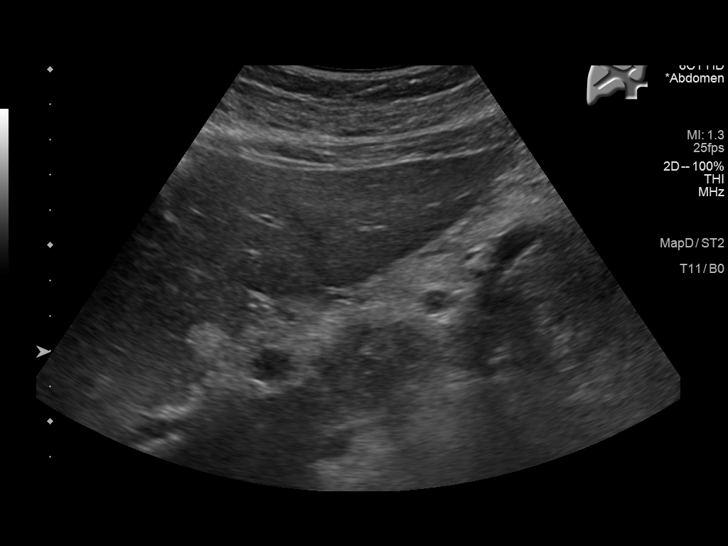
[im 19/33]
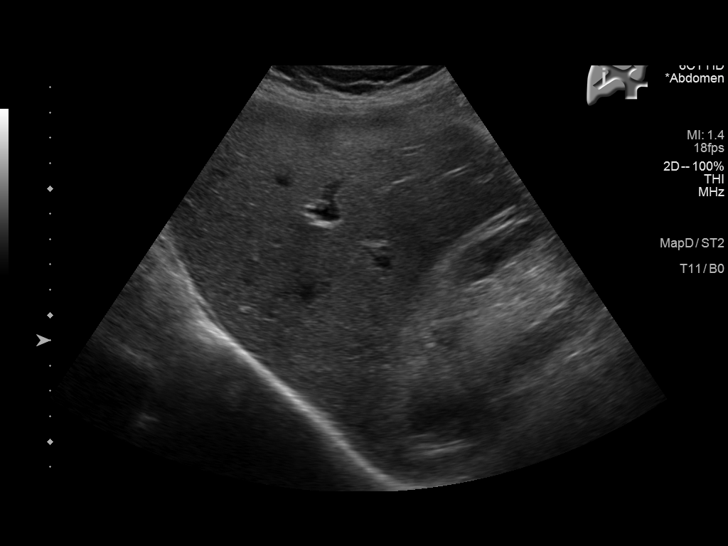
[im 22/33]
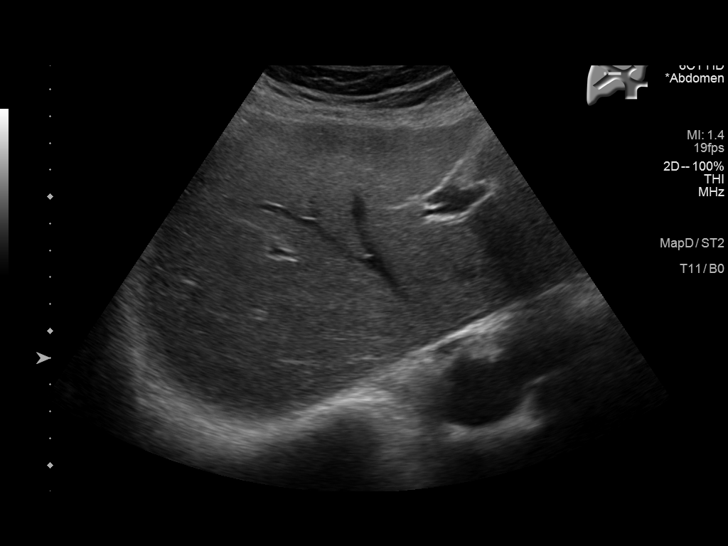
[im 25/33]
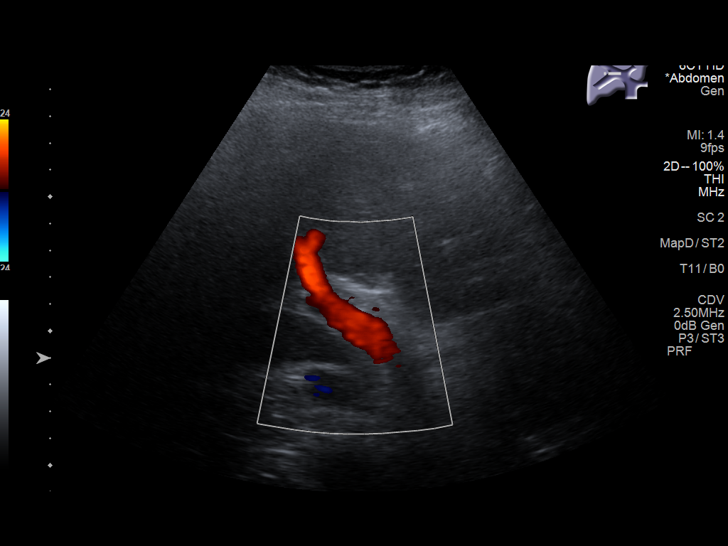
[im 27/33]
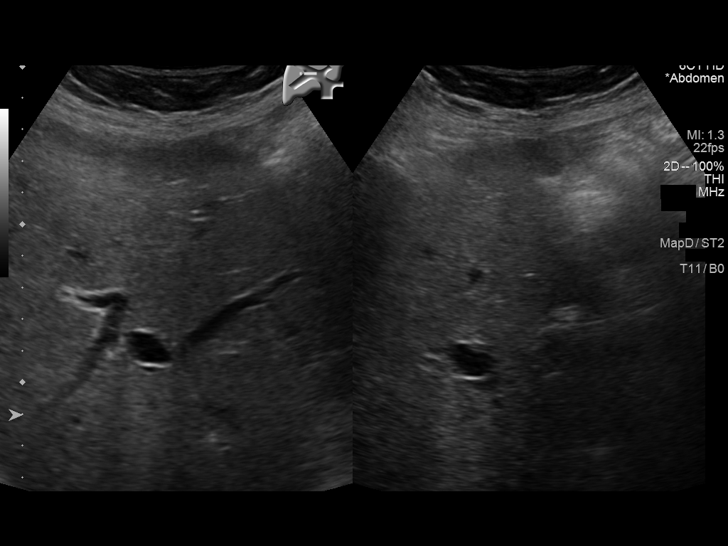
[im 30/33]
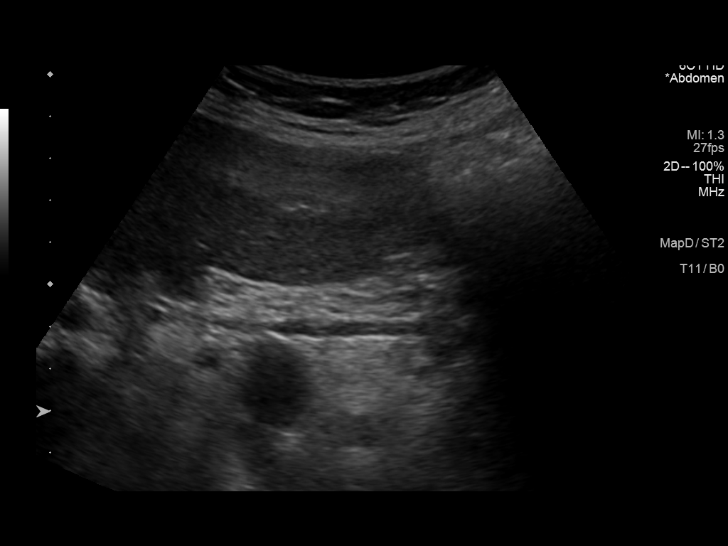
[im 33/33]
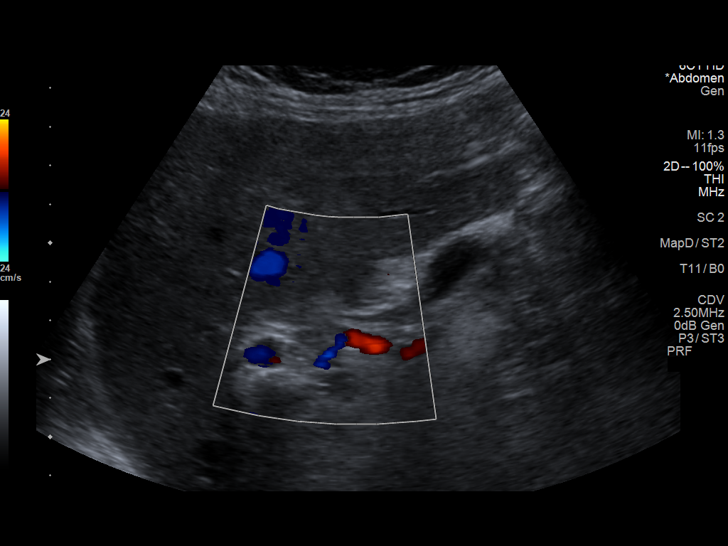

[13 of 25 positions shown; findings below may reference images not displayed]

FINDINGS: Gallbladder:

Small mobile echogenic foci within the gallbladder consistent with
cholelithiasis. The largest measures 3 mm. No evidence of
gallbladder wall thickening, pericholecystic fluid and the
sonographic Murphy sign was negative.

Common bile duct:

Diameter: Normal at 4 mm.

Liver:

Circumscribed anechoic simple cyst within the central aspect of the
right liver which measures 1.6 x 1.2 x 1.5 cm. Small circumscribed
hyperechoic focus along the capsule of the liver centrally adjacent
to the gallbladder fossa measuring 1.4 x 1.6 x 1.2 cm. No evidence
of internal vascularity on color Doppler imaging. Portal vein is
patent on color Doppler imaging with normal direction of blood flow
towards the liver.

Other: None.
IMPRESSION: 1. Cholelithiasis without evidence of cholecystitis.
2. Probable 1.6 cm benign hepatic hemangioma in the subcapsular
region of the right liver near the gallbladder fossa. Findings are
not diagnostic by ultrasound and follow-up is recommended to exclude
the less likely possibility of an echogenic primary or metastatic
neoplasm. Recommend follow-up right upper quadrant ultrasound in 3
months.
3. No evidence of biliary ductal dilatation.

## 2023-12-20 ENCOUNTER — Ambulatory Visit (INDEPENDENT_AMBULATORY_CARE_PROVIDER_SITE_OTHER)

## 2023-12-20 ENCOUNTER — Ambulatory Visit
Admission: EM | Admit: 2023-12-20 | Discharge: 2023-12-20 | Disposition: A | Discharge status: Home / Self Care | Attending: Family Medicine | Admitting: Family Medicine

## 2023-12-20 DIAGNOSIS — R0781 Pleurodynia: Secondary | ICD-10-CM | POA: Diagnosis not present

## 2023-12-20 DIAGNOSIS — W19XXXA Unspecified fall, initial encounter: Secondary | ICD-10-CM

## 2023-12-20 MED ORDER — TRAMADOL HCL 50 MG PO TABS: 50.0000 mg | ORAL_TABLET | Freq: Four times a day (QID) | ORAL | 0 refills | Status: AC | PRN

## 2023-12-20 NOTE — Discharge Instructions (Addendum)
 No broken or dislocated ribs seen on your xray. Be sure to take at least 10 deep breathes an hour to prevent pneumonia.  Return to the urgent care if you develop a cough, as you may likely need antibiotics.    Take 1000 mg of Tylenol every 6-8 hours as needed for pain.  Tramadol  a narcotic pain medication was sent to the pharmacy for severe pain. Do not drive or operate heavy machinery while taking this medication.

## 2023-12-20 NOTE — ED Triage Notes (Signed)
 Pt c/o fall on 12/19/23 at 2pm.  Pt states that she has right flank and back pain

## 2023-12-20 NOTE — ED Provider Notes (Signed)
 [facility]    CSN: [id number] Arrival date & time: 12/20/23  1420      History   Chief Complaint Chief Complaint  Patient presents with   Fall    HPI  HPI Mariska Daffin is a 78 y.o. female.   Chanika presents after a fall yesterday with injury to her right flank that radiates upper pain. She was going into the dinner room carrying some foot. Says missed a step and fell onto her knee and right side Taking Tylenol and [employer] patch with mild relief.  Her daughter made her go to the urgent care.    ***Injury occurred, mechanism and which ***shoulder injured.  Reports *** immediate pain in his shoulder ***. Did ***not hear any pop or abnormal sounds with his injury.  Continues to have *** description pain when moving his arm forward. Denies ***redness, swelling. Does not feel like her ***arm is weak. Has tried *** with little relief.  ***No change in pain day vs night.  Has ***never injured this *** shoulder before.  ***she is ***right handed.    Fever : no  Sore throat: no   Cough: no Appetite: normal  Hydration: normal  Abdominal pain: no Nausea: no Vomiting: no Sleep disturbance: no *** Back Pain: no Headache: no     Past Medical History:  Diagnosis Date   Hypercholesteremia    Hypertension     There are no active problems to display for this patient.   Past Surgical History:  Procedure Laterality Date   ABDOMINAL HYSTERECTOMY     APPENDECTOMY     LEG SURGERY Left    LEG SURGERY     x4   TONSILLECTOMY      OB History   No obstetric history on file.      Home Medications    Prior to Admission medications   Medication Sig Start Date End Date Taking? Authorizing Provider  Ascorbic Acid (VITAMIN C) 1000 MG tablet Take 1,000 mg by mouth daily.   Yes [provider]  atorvastatin (LIPITOR) 40 MG tablet Take 1 tablet by mouth daily. 09/14/20  Yes [provider]  buPROPion (WELLBUTRIN) 75 MG tablet Take 75 mg by mouth every  morning. 09/18/20  Yes [provider]  cholecalciferol (VITAMIN D3) 25 MCG (1000 UNIT) tablet Take 1,000 Units by mouth daily.   Yes [provider]  citalopram (CELEXA) 20 MG tablet Take 20 mg by mouth daily.   Yes [provider]  labetalol (NORMODYNE) 200 MG tablet Take 200 mg by mouth 2 (two) times daily. 09/01/20  Yes [provider]  potassium chloride (K-DUR,KLOR-CON) 10 MEQ tablet Take 10 mEq by mouth 4 (four) times daily.   Yes [provider]  telmisartan (MICARDIS) 80 MG tablet Take 80 mg by mouth daily. 09/28/20  Yes [provider]  traMADol  (ULTRAM ) 50 MG tablet Take 1 tablet (50 mg total) by mouth every 8 (eight) hours as needed for severe pain. 11/11/20  Yes Burky, Natalie B, NP  vitamin B-12 (CYANOCOBALAMIN) 500 MCG tablet Take 500 mcg by mouth daily.   Yes [provider]  vitamin E 1000 UNIT capsule Take 1,000 Units by mouth daily.   Yes [provider]  Zinc Sulfate (ZINC-220 PO) Take by mouth.   Yes [provider]  lisinopril (PRINIVIL,ZESTRIL) 40 MG tablet Take 40 mg by mouth daily.    [provider]  simvastatin (ZOCOR) 20 MG tablet Take 20 mg by mouth daily.  [provider]    Family History Family History  Problem Relation Age of Onset   Coronary artery disease Mother    Hypertension Mother    Coronary artery disease Father    Hypertension Father    Cancer Sister    Cancer Brother     Social History Social History   Tobacco Use   Smoking status: Never   Smokeless tobacco: Never  Vaping Use   Vaping status: Never Used  Substance Use Topics   Alcohol use: No   Drug use: Never     Allergies   Estradiol, Nafcillin, Naproxen, and Other   Review of Systems Review of Systems: :negative unless otherwise stated in HPI.      Physical Exam Triage Vital Signs ED Triage Vitals  Encounter Vitals Group     BP 12/20/23 1441 (!) 143/80     Girls Systolic BP  Percentile --      Girls Diastolic BP Percentile --      Boys Systolic BP Percentile --      Boys Diastolic BP Percentile --      Pulse Rate 12/20/23 1441 85     Resp --      Temp 12/20/23 1441 98.4 F (36.9 C)     Temp Source 12/20/23 1441 Oral     SpO2 12/20/23 1441 90 %     Weight 12/20/23 1442 206 lb (93.4 kg)     Height 12/20/23 1442 5' 7 (1.702 m)     Head Circumference --      Peak Flow --      Pain Score 12/20/23 1441 10     Pain Loc --      Pain Education --      Exclude from Growth Chart --    No data found.  Updated Vital Signs BP (!) 143/80 (BP Location: Left Arm)   Pulse 85   Temp 98.4 F (36.9 C) (Oral)   Ht 5' 7 (1.702 m)   Wt 93.4 kg   SpO2 90%   BMI 32.26 kg/m   Visual Acuity Right Eye Distance:   Left Eye Distance:   Bilateral Distance:    Right Eye Near:   Left Eye Near:    Bilateral Near:     Physical Exam GEN: well appearing female in no acute distress  CVS: well perfused  RESP: speaking in full sentences without pause, no respiratory distress  MSK:   *** shoulder:  No evidence of bony deformity, asymmetry, or muscle atrophy. No tenderness over long head of biceps (bicipital groove).  No TTP at [facility] joint.  Full active and passive (ABD, ADD, Flexion, extension, IR, ER). Limited *** 2/2 to pain.  Strength 5/5 grip, elbow and shoulder. No abnormal scapular function observed.  Special Tests: Vonzell: ***; Empty Can: ***, Neer's: Negative; Painful arc: Negative; Anterior Apprehension: Negative Sensation intact. Peripheral pulses intact.   UC Treatments / Results  Labs (all labs ordered are listed, but only abnormal results are displayed) Labs Reviewed - No data to display  EKG   Radiology No results found.   Procedures Procedures (including critical care time)  Medications Ordered in UC Medications - No data to display  Initial Impression / Assessment and Plan / UC Course  I have reviewed the triage vital signs and the  nursing notes.  Pertinent labs & imaging results that were available during my care of the patient were reviewed by me and considered in my medical decision making (see chart for  details).      Pt is a 78 y.o.  female with *** days of *** shoulder pain after ***.   On exam, pt has tenderness at *** concerning for ***.   Obtained *** shoulder plain films.  Personally interpreted by me were ***unremarkable for fracture or dislocation. Radiologist report reviewed and additionally notes *** no soft tissue swelling.  Given ***Toradol IM/sling/brace/crutches  Patient to gradually return to normal activities, as tolerated and continue ordinary activities within the limits permitted by pain. Prescribed Naproxen sodium *** and muscle relaxer *** for pain relief.  Tylenol PRN. Advised patient to avoid OTC NSAIDs while taking prescription NSAID. Counseled patient on red flag symptoms and when to seek immediate care.  ***No red flags such as progressive major motor weakness.   Patient to follow up with orthopedic provider, if symptoms do not improve with conservative treatment.  Return and ED precautions given. Understanding voiced. Discussed MDM, treatment plan and plan for follow-up with patient/parent who agrees with plan.   Final Clinical Impressions(s) / UC Diagnoses   Final diagnoses:  None   Discharge Instructions   None    ED Prescriptions   None    PDMP not reviewed this encounter.

## 2024-01-17 ENCOUNTER — Encounter: Payer: Self-pay | Admitting: Emergency Medicine

## 2024-01-17 ENCOUNTER — Ambulatory Visit: Admission: EM | Admit: 2024-01-17 | Discharge: 2024-01-17 | Disposition: A | Discharge status: Home / Self Care

## 2024-01-17 DIAGNOSIS — R9431 Abnormal electrocardiogram [ECG] [EKG]: Secondary | ICD-10-CM

## 2024-01-17 DIAGNOSIS — R0789 Other chest pain: Secondary | ICD-10-CM | POA: Diagnosis not present

## 2024-01-17 DIAGNOSIS — I1A0 Resistant hypertension: Secondary | ICD-10-CM

## 2024-01-17 NOTE — Discharge Instructions (Signed)
 Your EKG is abnormal and since your blood pressure is so high with taking your meds, it is best that you go to the ER for further work up. We can't pull any copies of your recent EKG to compare it.

## 2024-01-17 NOTE — ED Provider Notes (Signed)
 [facility]    CSN: [id number] Arrival date & time: 01/17/24  1557      History   Chief Complaint Chief Complaint  Patient presents with   Hypertension   Nausea    HPI Kayla Copeland is a 78 y.o. female who presents with elevated BP x 2 days and has been taking her medication every day. Usually runs 120-130/ 80's and this week has been elevated.  Has started getting chest pain on L chest area like tightness and lasts for a few minutes. While the pain is present she denies sweating or SOB. Gets this pain every 2 hours and is not waking her up at night time. She has also been having a lot of lower leg. She fell 4-5 weeks ago and injured her R hip and R chest. Had negative xrays and had no fracture.  Pain is provoked with local palpation. Denies pain with deep breaths. She gets nausea, but does not have it when the chest pain occurs.  Has a stable cardiology appointment in June at which time she had an EKG. She is not taking any new HTN meds. She denies abdominal pain. Has chronic abdominal soreness for years, so this is not any different. Denies constipation. She does admits of having a HA.   Past Medical History:  Diagnosis Date   Hypercholesteremia    Hypertension     There are no active problems to display for this patient.   Past Surgical History:  Procedure Laterality Date   ABDOMINAL HYSTERECTOMY     APPENDECTOMY     LEG SURGERY Left    LEG SURGERY     x4   TONSILLECTOMY      OB History   No obstetric history on file.      Home Medications    Prior to Admission medications   Medication Sig Start Date End Date Taking? Authorizing Provider  aspirin EC 81 MG tablet Take 81 mg by mouth daily. 09/27/22  Yes [provider]  Ascorbic Acid (VITAMIN C) 1000 MG tablet Take 1,000 mg by mouth daily.    [provider]  atorvastatin (LIPITOR) 40 MG tablet Take 1 tablet by mouth daily. 09/14/20   [provider]  buPROPion (WELLBUTRIN)  75 MG tablet Take 75 mg by mouth every morning. 09/18/20   [provider]  cholecalciferol (VITAMIN D3) 25 MCG (1000 UNIT) tablet Take 1,000 Units by mouth daily.    [provider]  labetalol (NORMODYNE) 200 MG tablet Take 200 mg by mouth 2 (two) times daily. 09/01/20   [provider]  potassium chloride (K-DUR,KLOR-CON) 10 MEQ tablet Take 10 mEq by mouth 4 (four) times daily.    [provider]  sertraline (ZOLOFT) 100 MG tablet Take 100 mg by mouth daily.    [provider]  simvastatin (ZOCOR) 20 MG tablet Take 20 mg by mouth daily.    [provider]  spironolactone (ALDACTONE) 25 MG tablet Take 25 mg by mouth 2 (two) times daily.    [provider]  telmisartan (MICARDIS) 80 MG tablet Take 80 mg by mouth daily. 09/28/20   [provider]  traMADol  (ULTRAM ) 50 MG tablet Take 1 tablet (50 mg total) by mouth every 6 (six) hours as needed. 12/20/23   Brimage, Vondra, DO  vitamin B-12 (CYANOCOBALAMIN) 500 MCG tablet Take 500 mcg by mouth daily.    [provider]  vitamin E 1000 UNIT capsule Take 1,000 Units by mouth daily.  [provider]  Zinc Sulfate (ZINC-220 PO) Take by mouth.    [provider]    Family History Family History  Problem Relation Age of Onset   Coronary artery disease Mother    Hypertension Mother    Coronary artery disease Father    Hypertension Father    Cancer Sister    Cancer Brother     Social History Social History   Tobacco Use   Smoking status: Never   Smokeless tobacco: Never  Vaping Use   Vaping status: Never Used  Substance Use Topics   Alcohol use: No   Drug use: Never     Allergies   Estradiol, Nafcillin, Naproxen, and Other   Review of Systems Review of Systems As noted in HPI  Physical Exam Triage Vital Signs ED Triage Vitals  Encounter Vitals Group     BP 01/17/24 1619 (!) 182/95     Girls Systolic BP Percentile --      Girls  Diastolic BP Percentile --      Boys Systolic BP Percentile --      Boys Diastolic BP Percentile --      Pulse Rate 01/17/24 1620 83     Resp 01/17/24 1620 14     Temp 01/17/24 1620 98.4 F (36.9 C)     Temp Source 01/17/24 1620 Oral     SpO2 01/17/24 1620 94 %     Weight 01/17/24 1616 205 lb 14.6 oz (93.4 kg)     Height 01/17/24 1616 5' 7 (1.702 m)     Head Circumference --      Peak Flow --      Pain Score 01/17/24 1616 2     Pain Loc --      Pain Education --      Exclude from Growth Chart --    No data found.  Updated Vital Signs BP (!) 162/96 (BP Location: Right Arm)   Pulse 83   Temp 98.4 F (36.9 C) (Oral)   Resp 14   Ht 5' 7 (1.702 m)   Wt 205 lb 14.6 oz (93.4 kg)   SpO2 94%   BMI 32.25 kg/m   Visual Acuity Right Eye Distance:   Left Eye Distance:   Bilateral Distance:    Right Eye Near:   Left Eye Near:    Bilateral Near:     Physical Exam Vitals and nursing note reviewed.  Constitutional:      General: She is not in acute distress.    Appearance: She is obese. She is not ill-appearing, toxic-appearing or diaphoretic.  HENT:     Right Ear: External ear normal.     Left Ear: External ear normal.  Eyes:     General: No scleral icterus.    Conjunctiva/sclera: Conjunctivae normal.  Cardiovascular:     Rate and Rhythm: Normal rate and regular rhythm.     Heart sounds: No murmur heard. Pulmonary:     Effort: Pulmonary effort is normal.     Breath sounds: Normal breath sounds.     Comments: While in the room she experienced L mid chest pain and when I palpated that area I reproduced the pain she had been having. It lasted less than one minute and resolved.  Chest:     Chest wall: Tenderness present.  Abdominal:     General: Bowel sounds are normal.     Palpations: Abdomen is soft.     Tenderness: There is no guarding or rebound.  Comments: Has generalized tenderness on upper abdomen, but not more than her usual.   Musculoskeletal:     Cervical  back: Neck supple.  Skin:    General: Skin is warm and dry.     Findings: No bruising or rash.  Neurological:     Mental Status: She is alert and oriented to person, place, and time.     Comments: Uses a cane  Psychiatric:        Mood and Affect: Mood normal.        Behavior: Behavior normal.        Thought Content: Thought content normal.        Judgment: Judgment normal.      UC Treatments / Results  Labs (all labs ordered are listed, but only abnormal results are displayed) Labs Reviewed - No data to display  EKG NSR, ST and T abnormality consistent with anterior ischemia.  Abnormal EKG. No EKG for comparison available.   Radiology No results found.  Procedures Procedures (including critical care time)  Medications Ordered in UC Medications - No data to display  Initial Impression / Assessment and Plan / UC Course  I have reviewed the triage vital signs and the nursing notes.  Pertinent labs & imaging results that were available during my care of the patient were reviewed by me and considered in my medical decision making (see chart for details).  Costochondritis Uncontrolled HTN Chest pain with hx of CAD  I could not find another recent EKG to compare it to today's therefore I advised her to stop at her cardiologist next door and see if they could look at her EKG from today and compare it to the one they did in June. If they are closed, then  she will have her son drive her to [facility] in [place].    Final Clinical Impressions(s) / UC Diagnoses   Final diagnoses:  Chest wall pain  Nonspecific abnormal electrocardiogram (ECG) (EKG)  Resistant hypertension     Discharge Instructions      Your EKG is abnormal and since your blood pressure is so high with taking your meds, it is best that you go to the ER for further work up. We can't pull any copies of your recent EKG to compare it.      ED Prescriptions   None    PDMP not reviewed this  encounter.   Lindi Carter, PA-C 01/17/24 1705

## 2024-01-17 NOTE — ED Triage Notes (Signed)
 Patient states that her blood pressure was elevated 2 days ago when she went to see her Pulmonologist.  Patient states that her blood pressure was 160/85 today.  Patient reports nausea.  Patient denies V/D.  Patient reports chest pain off and on.  Patient reports headaches.  Patient denies changes in vision.

## 2024-01-17 NOTE — ED Notes (Signed)
 Patient is being discharged from the Urgent Care and sent to the [facility] Emergency Department via private vehicle . Per [facility], patient is in need of higher level of care due to Hypertension and Headache. Patient is aware and verbalizes understanding of plan of care.  Vitals:   01/17/24 1619 01/17/24 1620  BP: (!) 182/95 (!) 162/96  Pulse:  83  Resp:  14  Temp:  98.4 F (36.9 C)  SpO2:  94%

## 2024-05-21 ENCOUNTER — Other Ambulatory Visit: Payer: Self-pay | Admitting: Cardiology

## 2024-05-21 DIAGNOSIS — E782 Mixed hyperlipidemia: Secondary | ICD-10-CM

## 2024-05-21 DIAGNOSIS — I2510 Atherosclerotic heart disease of native coronary artery without angina pectoris: Secondary | ICD-10-CM

## 2024-05-21 DIAGNOSIS — R0789 Other chest pain: Secondary | ICD-10-CM

## 2024-05-21 DIAGNOSIS — I351 Nonrheumatic aortic (valve) insufficiency: Secondary | ICD-10-CM

## 2024-05-21 DIAGNOSIS — E669 Obesity, unspecified: Secondary | ICD-10-CM

## 2024-05-21 DIAGNOSIS — I10 Essential (primary) hypertension: Secondary | ICD-10-CM

## 2024-06-17 ENCOUNTER — Encounter (HOSPITAL_COMMUNITY): Payer: Self-pay

## 2024-06-17 ENCOUNTER — Other Ambulatory Visit: Payer: Self-pay | Admitting: Cardiology

## 2024-06-17 DIAGNOSIS — R0789 Other chest pain: Secondary | ICD-10-CM

## 2024-06-17 NOTE — Progress Notes (Signed)
 Orders only for Cardiac PET stress test consent.   Hamp Levine, PA-C

## 2024-06-18 ENCOUNTER — Encounter
Admission: RE | Admit: 2024-06-18 | Discharge: 2024-06-18 | Disposition: A | Source: Ambulatory Visit | Attending: Cardiology

## 2024-06-18 DIAGNOSIS — E782 Mixed hyperlipidemia: Secondary | ICD-10-CM | POA: Insufficient documentation

## 2024-06-18 DIAGNOSIS — E669 Obesity, unspecified: Secondary | ICD-10-CM | POA: Diagnosis present

## 2024-06-18 DIAGNOSIS — R0789 Other chest pain: Secondary | ICD-10-CM | POA: Diagnosis present

## 2024-06-18 DIAGNOSIS — I2510 Atherosclerotic heart disease of native coronary artery without angina pectoris: Secondary | ICD-10-CM | POA: Insufficient documentation

## 2024-06-18 DIAGNOSIS — I10 Essential (primary) hypertension: Secondary | ICD-10-CM | POA: Diagnosis present

## 2024-06-18 DIAGNOSIS — I351 Nonrheumatic aortic (valve) insufficiency: Secondary | ICD-10-CM | POA: Insufficient documentation

## 2024-06-18 MED ORDER — RUBIDIUM RB82 GENERATOR (RUBYFILL)
25.0000 | PACK | Freq: Once | INTRAVENOUS | Status: AC
  Administered 2024-06-18: 24.06 via INTRAVENOUS

## 2024-06-18 MED ORDER — RUBIDIUM RB82 GENERATOR (RUBYFILL)
25.0000 | PACK | Freq: Once | INTRAVENOUS | Status: AC
  Administered 2024-06-18: 24.03 via INTRAVENOUS

## 2024-06-18 MED ORDER — REGADENOSON 0.4 MG/5ML IV SOLN
INTRAVENOUS | Status: AC
  Filled 2024-06-18: qty 5

## 2024-06-18 MED ORDER — REGADENOSON 0.4 MG/5ML IV SOLN
0.4000 mg | Freq: Once | INTRAVENOUS | Status: AC
  Administered 2024-06-18: 0.4 mg via INTRAVENOUS
  Filled 2024-06-18: qty 5

## 2024-06-19 LAB — NM PET CT CARDIAC PERFUSION MULTI W/ABSOLUTE BLOODFLOW
MBFR: 2.67
Nuc Rest EF: 44 %
Nuc Stress EF: 50 %
Peak HR: 100 {beats}/min
Rest HR: 94 {beats}/min
Rest MBF: 0.9 ml/g/min
Rest Nuclear Isotope Dose: 24.1 mCi
SRS: 1
SSS: 0
ST Depression (mm): 0 mm
Stress MBF: 2.4 ml/g/min
Stress Nuclear Isotope Dose: 24 mCi
TID: 0.96

## 2024-06-25 ENCOUNTER — Ambulatory Visit
